# Patient Record
Sex: Male | Born: 1950
Health system: Southern US, Community
[De-identification: ages and names within clinical notes are randomized; demographics above are authoritative.]

## PROBLEM LIST (undated history)

## (undated) DIAGNOSIS — S83249A Other tear of medial meniscus, current injury, unspecified knee, initial encounter: Secondary | ICD-10-CM

## (undated) DIAGNOSIS — E785 Hyperlipidemia, unspecified: Secondary | ICD-10-CM

## (undated) DIAGNOSIS — I2510 Atherosclerotic heart disease of native coronary artery without angina pectoris: Secondary | ICD-10-CM

## (undated) DIAGNOSIS — F17200 Nicotine dependence, unspecified, uncomplicated: Secondary | ICD-10-CM

## (undated) DIAGNOSIS — I10 Essential (primary) hypertension: Secondary | ICD-10-CM

## (undated) DIAGNOSIS — I714 Abdominal aortic aneurysm, without rupture, unspecified: Secondary | ICD-10-CM

## (undated) DIAGNOSIS — C4490 Unspecified malignant neoplasm of skin, unspecified: Secondary | ICD-10-CM

## (undated) DIAGNOSIS — F329 Major depressive disorder, single episode, unspecified: Secondary | ICD-10-CM

## (undated) DIAGNOSIS — M1990 Unspecified osteoarthritis, unspecified site: Secondary | ICD-10-CM

## (undated) DIAGNOSIS — I219 Acute myocardial infarction, unspecified: Secondary | ICD-10-CM

## (undated) DIAGNOSIS — F32A Depression, unspecified: Secondary | ICD-10-CM

## (undated) DIAGNOSIS — G4730 Sleep apnea, unspecified: Secondary | ICD-10-CM

## (undated) HISTORY — DX: Essential (primary) hypertension: I10

## (undated) HISTORY — DX: Hyperlipidemia, unspecified: E78.5

## (undated) HISTORY — DX: Atherosclerotic heart disease of native coronary artery without angina pectoris: I25.10

## (undated) HISTORY — DX: Unspecified malignant neoplasm of skin, unspecified: C44.90

---
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
## 1986-12-19 HISTORY — PX: CHOLECYSTECTOMY: SHX55

## 1986-12-19 HISTORY — PX: APPENDECTOMY: SHX54

## 2008-12-19 HISTORY — PX: CERVICAL FUSION: SHX112

## 2015-06-01 HISTORY — PX: CARDIAC CATHETERIZATION: SHX172

## 2015-07-06 ENCOUNTER — Telehealth: Payer: Self-pay | Admitting: Internal Medicine

## 2015-07-06 ENCOUNTER — Encounter: Payer: Self-pay | Admitting: Internal Medicine

## 2015-07-07 NOTE — Telephone Encounter (Signed)
Close encounter 

## 2015-08-17 DIAGNOSIS — B029 Zoster without complications: Secondary | ICD-10-CM | POA: Diagnosis not present

## 2015-08-18 ENCOUNTER — Ambulatory Visit: Payer: Self-pay | Admitting: Internal Medicine

## 2015-09-08 ENCOUNTER — Encounter: Payer: Self-pay | Admitting: Internal Medicine

## 2015-09-08 ENCOUNTER — Ambulatory Visit (INDEPENDENT_AMBULATORY_CARE_PROVIDER_SITE_OTHER): Payer: Medicare Other | Admitting: Internal Medicine

## 2015-09-08 VITALS — BP 142/80 | HR 66 | Ht 75.0 in | Wt 256.0 lb

## 2015-09-08 DIAGNOSIS — Z79899 Other long term (current) drug therapy: Secondary | ICD-10-CM

## 2015-09-08 DIAGNOSIS — Z720 Tobacco use: Secondary | ICD-10-CM | POA: Insufficient documentation

## 2015-09-08 DIAGNOSIS — F17200 Nicotine dependence, unspecified, uncomplicated: Secondary | ICD-10-CM

## 2015-09-08 DIAGNOSIS — N529 Male erectile dysfunction, unspecified: Secondary | ICD-10-CM | POA: Insufficient documentation

## 2015-09-08 DIAGNOSIS — R0989 Other specified symptoms and signs involving the circulatory and respiratory systems: Secondary | ICD-10-CM | POA: Diagnosis not present

## 2015-09-08 DIAGNOSIS — R42 Dizziness and giddiness: Secondary | ICD-10-CM | POA: Insufficient documentation

## 2015-09-08 DIAGNOSIS — I10 Essential (primary) hypertension: Secondary | ICD-10-CM

## 2015-09-08 DIAGNOSIS — I2510 Atherosclerotic heart disease of native coronary artery without angina pectoris: Secondary | ICD-10-CM

## 2015-09-08 DIAGNOSIS — Z955 Presence of coronary angioplasty implant and graft: Secondary | ICD-10-CM | POA: Insufficient documentation

## 2015-09-08 DIAGNOSIS — E785 Hyperlipidemia, unspecified: Secondary | ICD-10-CM | POA: Diagnosis not present

## 2015-09-08 DIAGNOSIS — N528 Other male erectile dysfunction: Secondary | ICD-10-CM

## 2015-09-08 MED ORDER — METOPROLOL TARTRATE 25 MG PO TABS: 12.5000 mg | ORAL_TABLET | Freq: Two times a day (BID) | ORAL | Status: DC

## 2015-09-08 MED ORDER — LISINOPRIL 5 MG PO TABS: 5.0000 mg | ORAL_TABLET | Freq: Every day | ORAL | Status: DC

## 2015-09-08 NOTE — Progress Notes (Signed)
OFFICE NOTE  Chief Complaint:  Establish cardiologist  Primary Care Physician: Irven Shelling, MD  HPI:  Nathan Jones is a pleasant 64 year old male who recently had an acute MI in June 2016. This was in the [place] area. He tells me that the day of the MI he was at home and started having chest tightness and radiating pain to his left arm. He became acutely diaphoretic and ultimately called EMS. He was taken the hospital but that the time he got there his symptoms had resolved. Initial troponins were negative however he was monitored overnight and troponin then became positive. He underwent cardiac catheterization the next day and was found to have a severe proximal LAD lesion. He subsequently had a science 3.25 mm x 15 mm stent placed. 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. He reported a marked improvement in his energy level and symptoms after the stent placement however recently his wife noted that his energy levels have been declined somewhat. In review of his current medications he is taking Effient 10 mg daily but not taking any aspirin. He says that the cardiologist told him not to take aspirin, which is very hard to believe. He also is on lisinopril and metoprolol, 2 medicines which she said he was told that he would not need to take forever. Finally he is on Zocor which she says is helped his cholesterol, but he's been intolerant to Crestor and Lipitor in the past. He also describes some dizziness which he takes meclizine occasionally for.  PMHx:  Past Medical History  Diagnosis Date  . Hypertension   . CAD (coronary artery disease)   . Hyperlipidemia   . Skin cancer     Past Surgical History  Procedure Laterality Date  . Cardiac catheterization  06/01/2015    Xience DES to LAD (3.42mmx15mm) - [facility]  . Cholecystectomy  1988  . Appendectomy   1988  . Cervical fusion  2010    FAMHx:  Family History  Problem Relation Age of Onset  . Heart disease Father     pacemaker  . Heart disease Maternal Grandfather   . Stroke Maternal Grandmother     SOCHx:   reports that he has been smoking Cigarettes.  He has a 45 pack-year smoking history. He has never used smokeless tobacco. He reports that he drinks alcohol. He reports that he does not use illicit drugs.  ALLERGIES:  No Known Allergies  ROS: A comprehensive review of systems was negative except for: Neurological: positive for dizziness  HOME MEDS: Current Outpatient Prescriptions  Medication Sig Dispense Refill  . acetaminophen (TYLENOL) 650 MG CR tablet Take 650 mg by mouth as needed for pain.    Marland Kitchen aspirin EC 81 MG tablet Take 81 mg by mouth daily.    . Avanafil (STENDRA PO) Take by mouth as needed.    Marland Kitchen EFFIENT 10 MG TABS tablet Take 1 tablet by mouth daily.    Marland Kitchen lisinopril (PRINIVIL,ZESTRIL) 5 MG tablet Take 1 tablet (5 mg total) by mouth daily. 90 tablet 3  . meclizine (ANTIVERT) 25 MG tablet Take 1 tablet by mouth 2 (two) times daily.    . metoprolol tartrate (LOPRESSOR) 25 MG tablet Take 0.5 tablets (12.5 mg total) by mouth 2 (two) times daily. 90 tablet 3  . simvastatin (ZOCOR) 20 MG tablet Take 1 tablet by  mouth daily.     No current facility-administered medications for this visit.    LABS/IMAGING: No results found for this or any previous visit (from the past 48 hour(s)). No results found.  WEIGHTS: Wt Readings from Last 3 Encounters:  09/08/15 256 lb (116.121 kg)    VITALS: BP 142/80 mmHg  Pulse 66  Ht 6\' 3"  (1.905 m)  Wt 256 lb (116.121 kg)  BMI 32.00 kg/m2  EXAM: General appearance: alert and no distress Neck: no carotid bruit, no JVD and thyroid not enlarged, symmetric, no tenderness/mass/nodules Lungs: clear to auscultation bilaterally Heart: regular rate and rhythm, S1, S2 normal, no murmur, click, rub or gallop Abdomen: soft, non-tender;  bowel sounds normal; no masses,  no organomegaly Extremities: extremities normal, atraumatic, no cyanosis or edema Pulses: 2+ and symmetric Skin: Skin color, texture, turgor normal. No rashes or lesions Neurologic: Grossly normal Psych: Pleasant  EKG: Normal sinus rhythm at 66, left anterior fascicular block  ASSESSMENT: 1. Coronary artery disease status post science DES (3.25 mm 15 mm) to the proximal LAD 2. Dyslipidemia 3. Hypertension 4. Dizziness 5. Tobacco abuse  PLAN: 1.   Mr. Zielke generally feels well but has had some mild fatigue after stent placement to the LAD. He reports compliance with medications although he was told he did not need to be on aspirin. I reiterated the significant importance of dual antiplatelets therapy after an acute MI, especially with a drug-eluting stent. He will need to get 81 mg aspirin and take it in addition to Effient for at least 1 year uninterrupted. He will need a repeat check of his cholesterol and I'll order that along with metabolic profile. Blood pressure appears to be well-controlled. On exam today there was an abdominal bruit, therefore I'll order an ultrasound as he has a smoking history. We talked about smoking cessation today, but he said it's very difficult as is under stress, was moving and the fact that his wife smokes as well. We will request complete records from the hospital in [place].  Plan to see him back in 6-8 weeks to discuss the findings of his blood work and ultrasound.  Pixie Casino, MD, [facility] Attending Cardiologist [place] C Hilty 09/08/2015, 5:34 PM

## 2015-09-08 NOTE — Patient Instructions (Signed)
Medication Instructions:   START aspirin 81mg  once daily  Labwork:  FASTING lab work in 1-2 weeks - to check cholesterol & metabolic panel  Testing/Procedures:  Your physician has requested that you have an abdominal aorta duplex. During this test, an ultrasound is used to evaluate the aorta. Allow 30 minutes for this exam. Do not eat after midnight the day before and avoid carbonated beverages  Follow-Up:  6-8 week visit with Dr. Debara Pickett  Any Other Special Instructions Will Be Listed Below (If Applicable).

## 2015-09-09 ENCOUNTER — Encounter: Payer: Self-pay | Admitting: *Deleted

## 2015-09-15 ENCOUNTER — Ambulatory Visit (HOSPITAL_COMMUNITY)
Admission: RE | Admit: 2015-09-15 | Discharge: 2015-09-15 | Disposition: A | Discharge status: Home / Self Care | Payer: Medicare Other | Source: Ambulatory Visit | Attending: Internal Medicine | Admitting: Internal Medicine

## 2015-09-15 DIAGNOSIS — F17200 Nicotine dependence, unspecified, uncomplicated: Secondary | ICD-10-CM | POA: Insufficient documentation

## 2015-09-15 DIAGNOSIS — R0989 Other specified symptoms and signs involving the circulatory and respiratory systems: Secondary | ICD-10-CM | POA: Diagnosis not present

## 2015-09-15 DIAGNOSIS — Z720 Tobacco use: Secondary | ICD-10-CM | POA: Diagnosis not present

## 2015-09-15 DIAGNOSIS — I10 Essential (primary) hypertension: Secondary | ICD-10-CM | POA: Insufficient documentation

## 2015-09-15 DIAGNOSIS — I771 Stricture of artery: Secondary | ICD-10-CM | POA: Diagnosis not present

## 2015-09-15 DIAGNOSIS — E785 Hyperlipidemia, unspecified: Secondary | ICD-10-CM | POA: Insufficient documentation

## 2015-09-16 ENCOUNTER — Other Ambulatory Visit: Payer: Self-pay | Admitting: *Deleted

## 2015-09-16 DIAGNOSIS — I714 Abdominal aortic aneurysm, without rupture, unspecified: Secondary | ICD-10-CM

## 2015-09-16 DIAGNOSIS — E785 Hyperlipidemia, unspecified: Secondary | ICD-10-CM | POA: Diagnosis not present

## 2015-09-17 DIAGNOSIS — R42 Dizziness and giddiness: Secondary | ICD-10-CM | POA: Diagnosis not present

## 2015-09-20 LAB — CARDIO IQ(R) ADVANCED LIPID PANEL
Apolipoprotein B: 95 mg/dL (ref 52–109)
CHOLESTEROL/HDL RATIO (CARDIO IQ ADV LIPID PANEL): 5.2 calc — AB (ref <=5.0)
Cholesterol, Total: 156 mg/dL (ref 125–200)
HDL Cholesterol: 30 mg/dL — ABNORMAL LOW (ref >=40)
LDL LARGE: 5732 nmol/L (ref 4334–10815)
LDL Medium: 301 nmol/L (ref 167–465)
LDL Particle Number: 1514 nmol/L (ref 1016–2185)
LDL Peak Size: 210.9 Angstrom — ABNORMAL LOW (ref >=218.2)
LDL SMALL: 379 nmol/L (ref 123–441)
LDL, Calculated: 93 mg/dL
LIPOPROTEIN (A) (CARDIO IQ ADV LIPID PANEL): 235 nmol/L — AB (ref <75)
Non-HDL Cholesterol: 126 mg/dL
TRIGLYCERIDES (CARDIO IQ ADV LIPID PANEL): 164 mg/dL — AB

## 2015-09-21 ENCOUNTER — Other Ambulatory Visit: Payer: Self-pay | Admitting: *Deleted

## 2015-09-21 ENCOUNTER — Other Ambulatory Visit: Payer: Self-pay | Admitting: Otolaryngology

## 2015-09-21 DIAGNOSIS — R42 Dizziness and giddiness: Secondary | ICD-10-CM

## 2015-09-21 MED ORDER — ATORVASTATIN CALCIUM 80 MG PO TABS: 80.0000 mg | ORAL_TABLET | Freq: Every day | ORAL | Status: DC

## 2015-09-22 DIAGNOSIS — E785 Hyperlipidemia, unspecified: Secondary | ICD-10-CM | POA: Diagnosis not present

## 2015-09-22 DIAGNOSIS — I10 Essential (primary) hypertension: Secondary | ICD-10-CM | POA: Diagnosis not present

## 2015-09-22 DIAGNOSIS — I2510 Atherosclerotic heart disease of native coronary artery without angina pectoris: Secondary | ICD-10-CM | POA: Diagnosis not present

## 2015-09-22 DIAGNOSIS — C4490 Unspecified malignant neoplasm of skin, unspecified: Secondary | ICD-10-CM | POA: Diagnosis not present

## 2015-09-22 DIAGNOSIS — Z23 Encounter for immunization: Secondary | ICD-10-CM | POA: Diagnosis not present

## 2015-09-22 DIAGNOSIS — B029 Zoster without complications: Secondary | ICD-10-CM | POA: Diagnosis not present

## 2015-09-28 DIAGNOSIS — D485 Neoplasm of uncertain behavior of skin: Secondary | ICD-10-CM | POA: Diagnosis not present

## 2015-09-28 DIAGNOSIS — C44629 Squamous cell carcinoma of skin of left upper limb, including shoulder: Secondary | ICD-10-CM | POA: Diagnosis not present

## 2015-09-28 DIAGNOSIS — L821 Other seborrheic keratosis: Secondary | ICD-10-CM | POA: Diagnosis not present

## 2015-09-28 DIAGNOSIS — L570 Actinic keratosis: Secondary | ICD-10-CM | POA: Diagnosis not present

## 2015-09-28 DIAGNOSIS — L237 Allergic contact dermatitis due to plants, except food: Secondary | ICD-10-CM | POA: Diagnosis not present

## 2015-09-28 DIAGNOSIS — Z85828 Personal history of other malignant neoplasm of skin: Secondary | ICD-10-CM | POA: Diagnosis not present

## 2015-10-01 ENCOUNTER — Encounter: Payer: Self-pay | Admitting: Internal Medicine

## 2015-10-05 ENCOUNTER — Ambulatory Visit
Admission: RE | Admit: 2015-10-05 | Discharge: 2015-10-05 | Disposition: A | Discharge status: Home / Self Care | Payer: Medicare Other | Source: Ambulatory Visit | Attending: Otolaryngology | Admitting: Otolaryngology

## 2015-10-05 DIAGNOSIS — R42 Dizziness and giddiness: Secondary | ICD-10-CM

## 2015-10-05 MED ORDER — GADOBENATE DIMEGLUMINE 529 MG/ML IV SOLN
20.0000 mL | Freq: Once | INTRAVENOUS | Status: AC | PRN
  Administered 2015-10-05: 20 mL via INTRAVENOUS

## 2015-10-11 ENCOUNTER — Encounter: Payer: Self-pay | Admitting: Internal Medicine

## 2015-10-12 MED ORDER — PRAVASTATIN SODIUM 40 MG PO TABS: 40.0000 mg | ORAL_TABLET | Freq: Every evening | ORAL | Status: DC

## 2015-10-13 DIAGNOSIS — M545 Low back pain: Secondary | ICD-10-CM | POA: Diagnosis not present

## 2015-10-21 ENCOUNTER — Encounter: Payer: Self-pay | Admitting: Internal Medicine

## 2015-10-27 DIAGNOSIS — C44629 Squamous cell carcinoma of skin of left upper limb, including shoulder: Secondary | ICD-10-CM | POA: Diagnosis not present

## 2015-11-11 ENCOUNTER — Encounter: Payer: Self-pay | Admitting: Internal Medicine

## 2015-11-11 ENCOUNTER — Ambulatory Visit (INDEPENDENT_AMBULATORY_CARE_PROVIDER_SITE_OTHER): Payer: Medicare Other | Admitting: Internal Medicine

## 2015-11-11 VITALS — BP 124/68 | HR 76 | Ht 75.0 in | Wt 262.0 lb

## 2015-11-11 DIAGNOSIS — I2510 Atherosclerotic heart disease of native coronary artery without angina pectoris: Secondary | ICD-10-CM | POA: Diagnosis not present

## 2015-11-11 DIAGNOSIS — I7409 Other arterial embolism and thrombosis of abdominal aorta: Secondary | ICD-10-CM | POA: Diagnosis not present

## 2015-11-11 DIAGNOSIS — I714 Abdominal aortic aneurysm, without rupture, unspecified: Secondary | ICD-10-CM

## 2015-11-11 DIAGNOSIS — Z955 Presence of coronary angioplasty implant and graft: Secondary | ICD-10-CM | POA: Diagnosis not present

## 2015-11-11 DIAGNOSIS — I10 Essential (primary) hypertension: Secondary | ICD-10-CM | POA: Diagnosis not present

## 2015-11-11 NOTE — Progress Notes (Signed)
OFFICE NOTE  Chief Complaint:  Follow-up abdominal ultrasound  Primary Care Physician: Irven Shelling, MD  HPI:  Nathan Jones is a pleasant 64 year old male who recently had an acute MI in June 2016. This was in the [place] area. He tells me that the day of the MI he was at home and started having chest tightness and radiating pain to his left arm. He became acutely diaphoretic and ultimately called EMS. He was taken the hospital but that the time he got there his symptoms had resolved. Initial troponins were negative however he was monitored overnight and troponin then became positive. He underwent cardiac catheterization the next day and was found to have a severe proximal LAD lesion. He subsequently had a science 3.25 mm x 15 mm stent placed. 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. He reported a marked improvement in his energy level and symptoms after the stent placement however recently his wife noted that his energy levels have been declined somewhat. In review of his current medications he is taking Effient 10 mg daily but not taking any aspirin. He says that the cardiologist told him not to take aspirin, which is very hard to believe. He also is on lisinopril and metoprolol, 2 medicines which she said he was told that he would not need to take forever. Finally he is on Zocor which she says is helped his cholesterol, but he's been intolerant to Crestor and Lipitor in the past. He also describes some dizziness which he takes meclizine occasionally for.  Nathan Jones returns today for follow-up. He underwent abdominal ultrasound which demonstrated a 3 x 3.16.8 cm fusiform abdominal aortic aneurysm for the mid to distal aorta. There was also bilateral aortoiliac disease which is mild to moderate. I reiterated the fact that continued tobacco abuse is contributing to this. And also it  is important for him to have good cholesterol control. He tells me that this past weekend he stopped taking pravastatin because he is having problems with his stomach. He developed some constipation and bloating. He checked with the side effects of that medicine and it was listed under the 1% side effects. He says he stopped the pravastatin and thought that he felt a little bit better, but it also been doing a bowel regimen including stool softener, MiraLAX, simethicone and other over-the-counter medications.  PMHx:  Past Medical History  Diagnosis Date  . Hypertension   . CAD (coronary artery disease)   . Hyperlipidemia   . Skin cancer     Past Surgical History  Procedure Laterality Date  . Cardiac catheterization  06/01/2015    Xience DES to LAD (3.24mmx15mm) - [facility]  . Cholecystectomy  1988  . Appendectomy  1988  . Cervical fusion  2010    FAMHx:  Family History  Problem Relation Age of Onset  . Heart disease Father     pacemaker  . Heart disease Maternal Grandfather   . Stroke Maternal Grandmother     SOCHx:   reports that he has been smoking Cigarettes.  He has a 45 pack-year smoking history. He has never used smokeless tobacco. He reports that he drinks alcohol. He reports that he does not use illicit drugs.  ALLERGIES:  Allergies  Allergen Reactions  . Pravastatin Other (See Comments)    Constipation, gas, stomach pains    ROS: A  comprehensive review of systems was negative.  HOME MEDS: Current Outpatient Prescriptions  Medication Sig Dispense Refill  . acetaminophen (TYLENOL) 650 MG CR tablet Take 650 mg by mouth as needed for pain.    Marland Kitchen aspirin EC 81 MG tablet Take 81 mg by mouth daily.    . Avanafil (STENDRA PO) Take by mouth as needed.    Marland Kitchen EFFIENT 10 MG TABS tablet Take 1 tablet by mouth daily.    Marland Kitchen lisinopril (PRINIVIL,ZESTRIL) 5 MG tablet Take 1 tablet (5 mg total) by mouth daily. 90 tablet 3  . meclizine (ANTIVERT) 25 MG tablet Take 1  tablet by mouth 2 (two) times daily.    . metoprolol tartrate (LOPRESSOR) 25 MG tablet Take 0.5 tablets (12.5 mg total) by mouth 2 (two) times daily. 90 tablet 3  . pravastatin (PRAVACHOL) 40 MG tablet Take 1 tablet (40 mg total) by mouth every evening. 30 tablet 5   No current facility-administered medications for this visit.    LABS/IMAGING: No results found for this or any previous visit (from the past 48 hour(s)). No results found.  WEIGHTS: Wt Readings from Last 3 Encounters:  11/11/15 262 lb (118.842 kg)  09/08/15 256 lb (116.121 kg)    VITALS: BP 124/68 mmHg  Pulse 76  Ht 6\' 3"  (1.905 m)  Wt 262 lb (118.842 kg)  BMI 32.75 kg/m2  EXAM: Deferred  EKG: Deferred  ASSESSMENT: 1. Coronary artery disease status post Xience DES (3.25 mm 15 mm) to the proximal LAD, mild RCA and LCx disease, EF 55-60% 2. Dyslipidemia 3. Hypertension 4. Dizziness 5. Tobacco abuse 6. AAA 7. Bilateral aortoiliac disease  PLAN: 1.   Nathan Jones is doing fairly well. He had an episode with abdominal bloating which she attributed possibly to Pravachol. He stopped the medicine I asked him to stay off of it for couple of weeks. If his symptoms resolve he should reconsider starting it and contact me if the symptoms come back again. His abdominal and aneurysm screening was positive for abdominal aortic aneurysm. Although a small it will need to be followed by ultrasound again in one year. He also has bilateral mild to moderate aortoiliac disease. He denies any claudication or symptoms such as Leriche syndrome. I underscored the importance of continued dual antiplatelet therapy for at least one year uninterrupted. In addition, I've asked him to reach out to me if he's interested in smoking cessation. Plan to see him back in 6 months. We'll repeat his abdominal ultrasound in one year.  Pixie Casino, MD, [facility] Attending Cardiologist [place] C Vergie Zahm 11/11/2015, 9:39 AM

## 2015-11-11 NOTE — Patient Instructions (Signed)
Your physician wants you to follow-up in: 6 months with Dr. Hilty. You will receive a reminder letter in the mail two months in advance. If you don't receive a letter, please call our office to schedule the follow-up appointment.    

## 2015-11-19 DIAGNOSIS — B009 Herpesviral infection, unspecified: Secondary | ICD-10-CM | POA: Diagnosis not present

## 2016-01-12 ENCOUNTER — Encounter: Payer: Self-pay | Admitting: Internal Medicine

## 2016-01-28 DIAGNOSIS — G4733 Obstructive sleep apnea (adult) (pediatric): Secondary | ICD-10-CM | POA: Diagnosis not present

## 2016-01-28 DIAGNOSIS — E669 Obesity, unspecified: Secondary | ICD-10-CM | POA: Diagnosis not present

## 2016-01-28 DIAGNOSIS — I739 Peripheral vascular disease, unspecified: Secondary | ICD-10-CM | POA: Diagnosis not present

## 2016-01-28 DIAGNOSIS — I714 Abdominal aortic aneurysm, without rupture: Secondary | ICD-10-CM | POA: Diagnosis not present

## 2016-01-28 DIAGNOSIS — I2510 Atherosclerotic heart disease of native coronary artery without angina pectoris: Secondary | ICD-10-CM | POA: Diagnosis not present

## 2016-01-28 DIAGNOSIS — I252 Old myocardial infarction: Secondary | ICD-10-CM | POA: Diagnosis not present

## 2016-01-28 DIAGNOSIS — E7800 Pure hypercholesterolemia, unspecified: Secondary | ICD-10-CM | POA: Diagnosis not present

## 2016-01-28 DIAGNOSIS — Z6833 Body mass index (BMI) 33.0-33.9, adult: Secondary | ICD-10-CM | POA: Diagnosis not present

## 2016-01-28 DIAGNOSIS — F17200 Nicotine dependence, unspecified, uncomplicated: Secondary | ICD-10-CM | POA: Diagnosis not present

## 2016-03-07 ENCOUNTER — Telehealth: Payer: Self-pay | Admitting: Internal Medicine

## 2016-03-07 ENCOUNTER — Encounter: Payer: Self-pay | Admitting: Internal Medicine

## 2016-03-07 NOTE — Telephone Encounter (Signed)
I replied to him in [place].  DR.H

## 2016-03-07 NOTE — Telephone Encounter (Signed)
New message      Calling to see if the nurse has seen his mychart message.  Please read it and call pt

## 2016-03-07 NOTE — Telephone Encounter (Signed)
Encounter Messages  Expand AllCollapse All     Non-Urgent Medical Question     From   [facility]    To   Pixie Casino, MD    Sent   03/07/2016 6:27 AM       Starting about two weeks ago, I have been experiencing moderate pain down the back pf my right leg. Started working out in a [employer], so thought it might be related, but I have changed routine several times (frequency/weigh/reps/etc) - with no noticeable change in issues.   Pain is tolerable, most pronounced in morning - take two Tylenol tablets (500 mg each) in morning - pain only moderates, but is still noticeable.   Also, beginning to have bruising issues (usually on my arms and hands) that I cannot associated with injuries. I am fairly active in the yard (grass cutting, raking, etc).   Not sure if any of this is related to current meds, but wanted to bring to your attention. I am most concerned about the pain radiating down my right leg.   Thanks, Rhona Raider with patient - pain in leg is not relieved with rest Patient is having to take tylenol QAM and sometimes QHS  Informed him that I will make MD aware of his message/concerns.

## 2016-03-10 DIAGNOSIS — M5431 Sciatica, right side: Secondary | ICD-10-CM | POA: Diagnosis not present

## 2016-03-28 DIAGNOSIS — D0421 Carcinoma in situ of skin of right ear and external auricular canal: Secondary | ICD-10-CM | POA: Diagnosis not present

## 2016-03-28 DIAGNOSIS — D0462 Carcinoma in situ of skin of left upper limb, including shoulder: Secondary | ICD-10-CM | POA: Diagnosis not present

## 2016-03-28 DIAGNOSIS — D485 Neoplasm of uncertain behavior of skin: Secondary | ICD-10-CM | POA: Diagnosis not present

## 2016-03-28 DIAGNOSIS — L84 Corns and callosities: Secondary | ICD-10-CM | POA: Diagnosis not present

## 2016-03-28 DIAGNOSIS — L570 Actinic keratosis: Secondary | ICD-10-CM | POA: Diagnosis not present

## 2016-04-12 ENCOUNTER — Other Ambulatory Visit: Payer: Self-pay | Admitting: Internal Medicine

## 2016-04-12 DIAGNOSIS — M5431 Sciatica, right side: Secondary | ICD-10-CM

## 2016-04-19 ENCOUNTER — Ambulatory Visit
Admission: RE | Admit: 2016-04-19 | Discharge: 2016-04-19 | Disposition: A | Discharge status: Home / Self Care | Payer: Medicare Other | Source: Ambulatory Visit | Attending: Internal Medicine | Admitting: Internal Medicine

## 2016-04-19 DIAGNOSIS — M5431 Sciatica, right side: Secondary | ICD-10-CM

## 2016-04-19 DIAGNOSIS — M4806 Spinal stenosis, lumbar region: Secondary | ICD-10-CM | POA: Diagnosis not present

## 2016-04-23 ENCOUNTER — Other Ambulatory Visit: Payer: Self-pay | Admitting: Internal Medicine

## 2016-04-25 NOTE — Telephone Encounter (Signed)
Rx request sent to pharmacy.  

## 2016-05-10 DIAGNOSIS — Z6833 Body mass index (BMI) 33.0-33.9, adult: Secondary | ICD-10-CM | POA: Diagnosis not present

## 2016-05-10 DIAGNOSIS — M5126 Other intervertebral disc displacement, lumbar region: Secondary | ICD-10-CM | POA: Diagnosis not present

## 2016-05-19 ENCOUNTER — Ambulatory Visit (INDEPENDENT_AMBULATORY_CARE_PROVIDER_SITE_OTHER): Payer: Medicare Other | Admitting: Internal Medicine

## 2016-05-19 ENCOUNTER — Encounter: Payer: Self-pay | Admitting: Internal Medicine

## 2016-05-19 VITALS — BP 118/72 | HR 66 | Ht 75.0 in | Wt 265.0 lb

## 2016-05-19 DIAGNOSIS — Z720 Tobacco use: Secondary | ICD-10-CM | POA: Diagnosis not present

## 2016-05-19 DIAGNOSIS — E785 Hyperlipidemia, unspecified: Secondary | ICD-10-CM

## 2016-05-19 DIAGNOSIS — I7409 Other arterial embolism and thrombosis of abdominal aorta: Secondary | ICD-10-CM

## 2016-05-19 DIAGNOSIS — I2510 Atherosclerotic heart disease of native coronary artery without angina pectoris: Secondary | ICD-10-CM | POA: Insufficient documentation

## 2016-05-19 DIAGNOSIS — I714 Abdominal aortic aneurysm, without rupture, unspecified: Secondary | ICD-10-CM

## 2016-05-19 DIAGNOSIS — F17200 Nicotine dependence, unspecified, uncomplicated: Secondary | ICD-10-CM | POA: Insufficient documentation

## 2016-05-19 DIAGNOSIS — I10 Essential (primary) hypertension: Secondary | ICD-10-CM

## 2016-05-19 LAB — LIPID PANEL
CHOLESTEROL: 150 mg/dL (ref 125–200)
HDL: 31 mg/dL — ABNORMAL LOW (ref >=40)
LDL Cholesterol: 90 mg/dL (ref <130)
TRIGLYCERIDES: 147 mg/dL (ref <150)
Total CHOL/HDL Ratio: 4.8 Ratio (ref <=5.0)
VLDL: 29 mg/dL (ref <30)

## 2016-05-19 NOTE — Progress Notes (Signed)
OFFICE NOTE  Chief Complaint:  Routine follow-up  Primary Care Physician: Irven Shelling, MD  HPI:  Nathan Jones is a pleasant 65 year old male who recently had an acute MI in June 2016. This was in the [place] area. He tells me that the day of the MI he was at home and started having chest tightness and radiating pain to his left arm. He became acutely diaphoretic and ultimately called EMS. He was taken the hospital but that the time he got there his symptoms had resolved. Initial troponins were negative however he was monitored overnight and troponin then became positive. He underwent cardiac catheterization the next day and was found to have a severe proximal LAD lesion. He subsequently had a science 3.25 mm x 15 mm stent placed. 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. He reported a marked improvement in his energy level and symptoms after the stent placement however recently his wife noted that his energy levels have been declined somewhat. In review of his current medications he is taking Effient 10 mg daily but not taking any aspirin. He says that the cardiologist told him not to take aspirin, which is very hard to believe. He also is on lisinopril and metoprolol, 2 medicines which she said he was told that he would not need to take forever. Finally he is on Zocor which she says is helped his cholesterol, but he's been intolerant to Crestor and Lipitor in the past. He also describes some dizziness which he takes meclizine occasionally for.  Nathan Jones returns today for follow-up. He underwent abdominal ultrasound which demonstrated a 3 x 3.16.8 cm fusiform abdominal aortic aneurysm for the mid to distal aorta. There was also bilateral aortoiliac disease which is mild to moderate. I reiterated the fact that continued tobacco abuse is contributing to this. And also it is important  for him to have good cholesterol control. He tells me that this past weekend he stopped taking pravastatin because he is having problems with his stomach. He developed some constipation and bloating. He checked with the side effects of that medicine and it was listed under the 1% side effects. He says he stopped the pravastatin and thought that he felt a little bit better, but it also been doing a bowel regimen including stool softener, MiraLAX, simethicone and other over-the-counter medications.  05/19/2016  Nathan Jones returns today for follow-up. He is doing very well. He denies any chest pain or worsening shortness of breath. We switched him to atorvastatin but he was intolerant of this. Ultimately was placed on pravastatin 40 mg which she seems to be tolerating. It is been one year since his drug-eluting stent and he can likely come off of Effient which she is interested in. As mentioned before he does have a small abdominal aortic aneurysm which will need reassessment this fall. Unfortunately continues to smoke but has tried to cut back.  PMHx:  Past Medical History  Diagnosis Date  . Hypertension   . CAD (coronary artery disease)   . Hyperlipidemia   . Skin cancer     Past Surgical History  Procedure Laterality Date  . Cardiac catheterization  06/01/2015    Xience DES to LAD (3.55mmx15mm) - [facility]  . Cholecystectomy  1988  . Appendectomy  1988  . Cervical fusion  2010    FAMHx:  Family History  Problem Relation Age of Onset  . Heart disease Father     pacemaker  . Heart disease Maternal Grandfather   . Stroke Maternal Grandmother     SOCHx:   reports that he has been smoking Cigarettes.  He has a 45 pack-year smoking history. He has never used smokeless tobacco. He reports that he drinks alcohol. He reports that he does not use illicit drugs.  ALLERGIES:  Allergies  Allergen Reactions  . Atorvastatin     Gas, stomach pains    ROS: Pertinent items noted  in HPI and remainder of comprehensive ROS otherwise negative.  HOME MEDS: Current Outpatient Prescriptions  Medication Sig Dispense Refill  . acetaminophen (TYLENOL) 650 MG CR tablet Take 650 mg by mouth as needed for pain.    Marland Kitchen aspirin EC 81 MG tablet Take 81 mg by mouth daily.    . Avanafil (STENDRA PO) Take by mouth daily as needed.    Marland Kitchen lisinopril (PRINIVIL,ZESTRIL) 5 MG tablet Take 1 tablet (5 mg total) by mouth daily. 90 tablet 3  . metoprolol tartrate (LOPRESSOR) 25 MG tablet Take 0.5 tablets (12.5 mg total) by mouth 2 (two) times daily. 90 tablet 3  . pravastatin (PRAVACHOL) 40 MG tablet TAKE ONE TABLET (40 MG) BY MOUTH EVERY EVENING. 30 tablet 4  . ValACYclovir HCl (VALTREX PO) Take 1,000 mg by mouth as needed.     No current facility-administered medications for this visit.    LABS/IMAGING: No results found for this or any previous visit (from the past 48 hour(s)). No results found.  WEIGHTS: Wt Readings from Last 3 Encounters:  05/19/16 265 lb (120.203 kg)  11/11/15 262 lb (118.842 kg)  09/08/15 256 lb (116.121 kg)    VITALS: BP 118/72 mmHg  Pulse 66  Ht 6\' 3"  (1.905 m)  Wt 265 lb (120.203 kg)  BMI 33.12 kg/m2  EXAM: General appearance: alert and no distress Lungs: clear to auscultation bilaterally Heart: regular rate and rhythm, S1, S2 normal, no murmur, click, rub or gallop Extremities: extremities normal, atraumatic, no cyanosis or edema Skin: some ecchymosis Neurologic: Grossly normal  EKG: NSR at 66  ASSESSMENT: 1. Coronary artery disease status post Xience DES (3.25 mm 15 mm) to the proximal LAD, mild RCA and LCx disease, EF 55-60% (05/2015) 2. Dyslipidemia 3. Hypertension 4. Dizziness 5. Tobacco abuse 6. AAA 7. Bilateral aortoiliac disease  PLAN: 1.   Nathan Jones continues to do well after his stent 1 year ago. At this point he can discontinue Effient and he will remain on low-dose aspirin. He will need a follow-up ultrasound of his AAA which  will be done in September of this year. He is tolerating pravastatin and we'll need to recheck his lipid profile to see if adjustments need to be made. Unfortunately he was intolerant of atorvastatin. He is continue to work on tobacco cessation however he says it's quite difficult as his wife and daughter all smoke as well. Plan to see him back in 6 months.  Pixie Casino, MD, [facility] Attending Cardiologist [facility]  Pixie Casino 05/19/2016, 9:18 AM

## 2016-05-19 NOTE — Patient Instructions (Addendum)
Your physician has recommended you make the following change in your medication: STOP effient  Your physician recommends that you return for lab work FASTING  Your physician wants you to follow-up in: 6 months with Dr. Debara Pickett. You will receive a reminder letter in the mail two months in advance. If you don't receive a letter, please call our office to schedule the follow-up appointment.

## 2016-05-20 ENCOUNTER — Encounter: Payer: Self-pay | Admitting: Internal Medicine

## 2016-05-26 ENCOUNTER — Telehealth: Payer: Self-pay | Admitting: Internal Medicine

## 2016-05-26 NOTE — Telephone Encounter (Signed)
All questions addressed regarding pt's e-mail inquiry. Advised OK to proceed w routine dental work since he has been off Effient x1 week. Pt voiced thanks.

## 2016-05-26 NOTE — Telephone Encounter (Signed)
New message      Pt sent Dr Debara Pickett a mychart message.  Please look at his request and call him back.  He has a question about his most recent cholesterol test, future dental appts and how long does it take effient to get out of his system once stopping medication?  Please call

## 2016-06-14 DIAGNOSIS — Z0000 Encounter for general adult medical examination without abnormal findings: Secondary | ICD-10-CM | POA: Diagnosis not present

## 2016-06-14 DIAGNOSIS — E7800 Pure hypercholesterolemia, unspecified: Secondary | ICD-10-CM | POA: Diagnosis not present

## 2016-06-14 DIAGNOSIS — Z125 Encounter for screening for malignant neoplasm of prostate: Secondary | ICD-10-CM | POA: Diagnosis not present

## 2016-06-14 DIAGNOSIS — F17210 Nicotine dependence, cigarettes, uncomplicated: Secondary | ICD-10-CM | POA: Diagnosis not present

## 2016-06-14 DIAGNOSIS — I252 Old myocardial infarction: Secondary | ICD-10-CM | POA: Diagnosis not present

## 2016-06-14 DIAGNOSIS — I2510 Atherosclerotic heart disease of native coronary artery without angina pectoris: Secondary | ICD-10-CM | POA: Diagnosis not present

## 2016-06-14 DIAGNOSIS — Z1389 Encounter for screening for other disorder: Secondary | ICD-10-CM | POA: Diagnosis not present

## 2016-06-14 DIAGNOSIS — Z6833 Body mass index (BMI) 33.0-33.9, adult: Secondary | ICD-10-CM | POA: Diagnosis not present

## 2016-06-14 DIAGNOSIS — I739 Peripheral vascular disease, unspecified: Secondary | ICD-10-CM | POA: Diagnosis not present

## 2016-06-14 DIAGNOSIS — E669 Obesity, unspecified: Secondary | ICD-10-CM | POA: Diagnosis not present

## 2016-06-14 DIAGNOSIS — I714 Abdominal aortic aneurysm, without rupture: Secondary | ICD-10-CM | POA: Diagnosis not present

## 2016-06-24 DIAGNOSIS — D751 Secondary polycythemia: Secondary | ICD-10-CM | POA: Diagnosis not present

## 2016-08-02 DIAGNOSIS — G4733 Obstructive sleep apnea (adult) (pediatric): Secondary | ICD-10-CM | POA: Diagnosis not present

## 2016-08-04 DIAGNOSIS — G4733 Obstructive sleep apnea (adult) (pediatric): Secondary | ICD-10-CM | POA: Diagnosis not present

## 2016-08-08 DIAGNOSIS — D751 Secondary polycythemia: Secondary | ICD-10-CM | POA: Diagnosis not present

## 2016-08-19 DIAGNOSIS — G4733 Obstructive sleep apnea (adult) (pediatric): Secondary | ICD-10-CM | POA: Diagnosis not present

## 2016-08-23 DIAGNOSIS — Z86010 Personal history of colonic polyps: Secondary | ICD-10-CM | POA: Diagnosis not present

## 2016-08-23 DIAGNOSIS — D126 Benign neoplasm of colon, unspecified: Secondary | ICD-10-CM | POA: Diagnosis not present

## 2016-08-23 DIAGNOSIS — K5730 Diverticulosis of large intestine without perforation or abscess without bleeding: Secondary | ICD-10-CM | POA: Diagnosis not present

## 2016-08-23 DIAGNOSIS — K635 Polyp of colon: Secondary | ICD-10-CM | POA: Diagnosis not present

## 2016-08-23 DIAGNOSIS — D122 Benign neoplasm of ascending colon: Secondary | ICD-10-CM | POA: Diagnosis not present

## 2016-08-23 DIAGNOSIS — D125 Benign neoplasm of sigmoid colon: Secondary | ICD-10-CM | POA: Diagnosis not present

## 2016-08-23 DIAGNOSIS — K5520 Angiodysplasia of colon without hemorrhage: Secondary | ICD-10-CM | POA: Diagnosis not present

## 2016-08-30 DIAGNOSIS — D126 Benign neoplasm of colon, unspecified: Secondary | ICD-10-CM | POA: Diagnosis not present

## 2016-09-02 DIAGNOSIS — G4733 Obstructive sleep apnea (adult) (pediatric): Secondary | ICD-10-CM | POA: Diagnosis not present

## 2016-09-20 DIAGNOSIS — Z23 Encounter for immunization: Secondary | ICD-10-CM | POA: Diagnosis not present

## 2016-09-20 DIAGNOSIS — Z85828 Personal history of other malignant neoplasm of skin: Secondary | ICD-10-CM | POA: Diagnosis not present

## 2016-09-20 DIAGNOSIS — L821 Other seborrheic keratosis: Secondary | ICD-10-CM | POA: Diagnosis not present

## 2016-09-20 DIAGNOSIS — L570 Actinic keratosis: Secondary | ICD-10-CM | POA: Diagnosis not present

## 2016-09-20 DIAGNOSIS — L814 Other melanin hyperpigmentation: Secondary | ICD-10-CM | POA: Diagnosis not present

## 2016-09-20 DIAGNOSIS — D1801 Hemangioma of skin and subcutaneous tissue: Secondary | ICD-10-CM | POA: Diagnosis not present

## 2016-09-20 DIAGNOSIS — D225 Melanocytic nevi of trunk: Secondary | ICD-10-CM | POA: Diagnosis not present

## 2016-09-23 ENCOUNTER — Other Ambulatory Visit: Payer: Self-pay | Admitting: Internal Medicine

## 2016-09-26 ENCOUNTER — Ambulatory Visit (INDEPENDENT_AMBULATORY_CARE_PROVIDER_SITE_OTHER): Payer: Medicare Other | Admitting: Podiatry

## 2016-09-26 ENCOUNTER — Encounter: Payer: Self-pay | Admitting: Podiatry

## 2016-09-26 DIAGNOSIS — Q828 Other specified congenital malformations of skin: Secondary | ICD-10-CM | POA: Diagnosis not present

## 2016-09-26 DIAGNOSIS — I2510 Atherosclerotic heart disease of native coronary artery without angina pectoris: Secondary | ICD-10-CM

## 2016-09-26 NOTE — Progress Notes (Signed)
   Subjective:    Patient ID: Nathan Jones, male    DOB: 1951-11-04, 65 y.o.   MRN: [id number]  HPI  65 year old male presents the office today for concerns of a painful lesion to the bottom of his left foot which is been ongoing the last several months. He denies of any foreign objects he's had no treatment for this. He only notices it when he puts pressure to the foot at times or when rubbing his foot. Denies any redness or drainage or any swelling. No other complaints.   Review of Systems  HENT: Positive for hearing loss.   Respiratory: Positive for shortness of breath.   Musculoskeletal: Positive for back pain.  All other systems reviewed and are negative.      Objective:   Physical Exam General: AAO x3, NAD  Dermatological: Hyperkeratotic lesion present left foot submetatarsal 5. Upon debridement this is a annular punctate hyperkeratotic lesion consistent with a porokeratosis. There is no evidence of verruca there is no evidence of foreign body. No swelling erythema drainage or pus rate signs of infection. No other lesions identified.  Vascular: Dorsalis Pedis artery and Posterior Tibial artery pedal pulses are 2/4 bilateral with immedate capillary fill time. Pedal hair growth present. There is no pain with calf compression, swelling, warmth, erythema.   Neruologic: Grossly intact via light touch bilateral. Vibratory intact via tuning fork bilateral. Protective threshold with Semmes Wienstein monofilament intact to all pedal sites bilateral.   Musculoskeletal: No gross boney pedal deformities bilateral. No pain, crepitus, or limitation noted with foot and ankle range of motion bilateral. Muscular strength 5/5 in all groups tested bilateral.  Gait: Unassisted, Nonantalgic.     Assessment & Plan:  Left foot sub-metatarsal 5 porokeratosis -Treatment options discussed including all alternatives, risks, and complications -Etiology of symptoms were discussed -Hyperkeratotic lesion was  debrided to the complications or bleeding. Area was cleaned and a pad was placed followed by salicylic acid and a bandage. Post procedure instructions were discussed. Monitor for infection. Follow up with symptoms not resolve the next 3-4 weeks or sooner if any issues are to arise. He agrees.   Celesta Gentile, DPM

## 2016-09-26 NOTE — Patient Instructions (Signed)
It was nice to meet you today. If you have any questions, please feel free to give [place] a call at [phone].

## 2016-09-26 NOTE — Progress Notes (Signed)
   Subjective:    Patient ID: Nathan Jones, male    DOB: 1951/11/14, 65 y.o.   MRN: [id number]  HPI I have a corn on my     Review of Systems  All other systems reviewed and are negative.      Objective:   Physical Exam        Assessment & Plan:

## 2016-10-24 ENCOUNTER — Other Ambulatory Visit: Payer: Self-pay | Admitting: Internal Medicine

## 2016-10-24 NOTE — Telephone Encounter (Signed)
Rx request sent to pharmacy.  

## 2016-11-02 DIAGNOSIS — Z23 Encounter for immunization: Secondary | ICD-10-CM | POA: Diagnosis not present

## 2016-11-02 DIAGNOSIS — N529 Male erectile dysfunction, unspecified: Secondary | ICD-10-CM | POA: Diagnosis not present

## 2016-11-02 DIAGNOSIS — G4733 Obstructive sleep apnea (adult) (pediatric): Secondary | ICD-10-CM | POA: Diagnosis not present

## 2016-11-02 DIAGNOSIS — D751 Secondary polycythemia: Secondary | ICD-10-CM | POA: Diagnosis not present

## 2016-11-14 ENCOUNTER — Telehealth: Payer: Self-pay | Admitting: Internal Medicine

## 2016-11-14 NOTE — Telephone Encounter (Signed)
Closed encounter °

## 2016-11-15 ENCOUNTER — Ambulatory Visit: Payer: Medicare Other | Admitting: Internal Medicine

## 2016-11-29 ENCOUNTER — Ambulatory Visit (INDEPENDENT_AMBULATORY_CARE_PROVIDER_SITE_OTHER): Payer: Medicare Other | Admitting: Internal Medicine

## 2016-11-29 VITALS — BP 114/58 | HR 69 | Ht 75.0 in | Wt 276.0 lb

## 2016-11-29 DIAGNOSIS — E785 Hyperlipidemia, unspecified: Secondary | ICD-10-CM | POA: Diagnosis not present

## 2016-11-29 DIAGNOSIS — I10 Essential (primary) hypertension: Secondary | ICD-10-CM | POA: Diagnosis not present

## 2016-11-29 DIAGNOSIS — M62830 Muscle spasm of back: Secondary | ICD-10-CM | POA: Diagnosis not present

## 2016-11-29 DIAGNOSIS — I2510 Atherosclerotic heart disease of native coronary artery without angina pectoris: Secondary | ICD-10-CM

## 2016-11-29 DIAGNOSIS — I714 Abdominal aortic aneurysm, without rupture, unspecified: Secondary | ICD-10-CM

## 2016-11-29 DIAGNOSIS — M5136 Other intervertebral disc degeneration, lumbar region: Secondary | ICD-10-CM | POA: Diagnosis not present

## 2016-11-29 DIAGNOSIS — R350 Frequency of micturition: Secondary | ICD-10-CM | POA: Diagnosis not present

## 2016-11-29 MED ORDER — EZETIMIBE 10 MG PO TABS: 10.0000 mg | ORAL_TABLET | Freq: Every day | ORAL | 3 refills | Status: DC

## 2016-11-29 NOTE — Progress Notes (Signed)
OFFICE NOTE  Chief Complaint:  Routine follow-up  Primary Care Physician: Irven Shelling, MD  HPI:  Nathan Jones is a pleasant 65 year old male who recently had an acute MI in June 2016. This was in the [place] area. He tells me that the day of the MI he was at home and started having chest tightness and radiating pain to his left arm. He became acutely diaphoretic and ultimately called EMS. He was taken the hospital but that the time he got there his symptoms had resolved. Initial troponins were negative however he was monitored overnight and troponin then became positive. He underwent cardiac catheterization the next day and was found to have a severe proximal LAD lesion. He subsequently had a science 3.25 mm x 15 mm stent placed. 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. He reported a marked improvement in his energy level and symptoms after the stent placement however recently his wife noted that his energy levels have been declined somewhat. In review of his current medications he is taking Effient 10 mg daily but not taking any aspirin. He says that the cardiologist told him not to take aspirin, which is very hard to believe. He also is on lisinopril and metoprolol, 2 medicines which she said he was told that he would not need to take forever. Finally he is on Zocor which she says is helped his cholesterol, but he's been intolerant to Crestor and Lipitor in the past. He also describes some dizziness which he takes meclizine occasionally for.  Nathan Jones returns today for follow-up. He underwent abdominal ultrasound which demonstrated a 3 x 3.16.8 cm fusiform abdominal aortic aneurysm for the mid to distal aorta. There was also bilateral aortoiliac disease which is mild to moderate. I reiterated the fact that continued tobacco abuse is contributing to this. And also it is important  for him to have good cholesterol control. He tells me that this past weekend he stopped taking pravastatin because he is having problems with his stomach. He developed some constipation and bloating. He checked with the side effects of that medicine and it was listed under the 1% side effects. He says he stopped the pravastatin and thought that he felt a little bit better, but it also been doing a bowel regimen including stool softener, MiraLAX, simethicone and other over-the-counter medications.  05/19/2016  Nathan Jones returns today for follow-up. He is doing very well. He denies any chest pain or worsening shortness of breath. We switched him to atorvastatin but he was intolerant of this. Ultimately was placed on pravastatin 40 mg which she seems to be tolerating. It is been one year since his drug-eluting stent and he can likely come off of Effient which she is interested in. As mentioned before he does have a small abdominal aortic aneurysm which will need reassessment this fall. Unfortunately continues to smoke but has tried to cut back.  11/29/2016  Nathan Jones returns today for follow-up. Overall he is feeling fairly well. He does report some acute back pain for which she has an appointment this afternoon with his primary care provider. He is on Vicodin for that. From a cardiac standpoint he denies any chest pain or worsening shortness of breath. He had a recent lipid profile in June which indicated a total cholesterol 150 with LDL of 90. Although this is good control, he could  be better controlled with new data indicating a lower cardiovascular event rate with LDL-C less than 50.   PMHx:  Past Medical History:  Diagnosis Date  . CAD (coronary artery disease)   . Hyperlipidemia   . Hypertension   . Skin cancer     Past Surgical History:  Procedure Laterality Date  . APPENDECTOMY  1988  . CARDIAC CATHETERIZATION  06/01/2015   Xience DES to LAD (3.50mmx15mm) - [facility]  .  CERVICAL FUSION  2010  . CHOLECYSTECTOMY  1988    FAMHx:  Family History  Problem Relation Age of Onset  . Heart disease Father     pacemaker  . Heart disease Maternal Grandfather   . Stroke Maternal Grandmother     SOCHx:   reports that he has been smoking Cigarettes.  He has a 45.00 pack-year smoking history. He has never used smokeless tobacco. He reports that he drinks alcohol. He reports that he does not use drugs.  ALLERGIES:  Allergies  Allergen Reactions  . Atorvastatin     Gas, stomach pains    ROS: Pertinent items noted in HPI and remainder of comprehensive ROS otherwise negative.  HOME MEDS: Current Outpatient Prescriptions  Medication Sig Dispense Refill  . aspirin EC 81 MG tablet Take 81 mg by mouth daily.    . Avanafil (STENDRA PO) Take by mouth daily as needed.    Marland Kitchen HYDROcodone-acetaminophen (NORCO/VICODIN) 5-325 MG tablet Take 1 tablet by mouth every 4 (four) hours as needed for moderate pain.    Marland Kitchen ibuprofen (ADVIL) 200 MG tablet Take 200 mg by mouth every 6 (six) hours as needed.    Marland Kitchen lisinopril (PRINIVIL,ZESTRIL) 5 MG tablet TAKE ONE TABLET BY MOUTH ONCE DAILY 30 tablet 11  . metoprolol tartrate (LOPRESSOR) 25 MG tablet TAKE ONE-HALF TABLET BY MOUTH TWO TIMES A DAY 30 tablet 11  . pravastatin (PRAVACHOL) 40 MG tablet TAKE ONE TABLET (40 MG) BY MOUTH EVERY EVENING. 90 tablet 3  . ValACYclovir HCl (VALTREX PO) Take 1,000 mg by mouth as needed.    . ezetimibe (ZETIA) 10 MG tablet Take 1 tablet (10 mg total) by mouth daily. 90 tablet 3   No current facility-administered medications for this visit.     LABS/IMAGING: No results found for this or any previous visit (from the past 48 hour(s)). No results found.  WEIGHTS: Wt Readings from Last 3 Encounters:  11/29/16 276 lb (125.2 kg)  05/19/16 265 lb (120.2 kg)  11/11/15 262 lb (118.8 kg)    VITALS: BP (!) 114/58   Pulse 69   Ht 6\' 3"  (1.905 m)   Wt 276 lb (125.2 kg)   BMI 34.50 kg/m    EXAM: General appearance: alert and no distress Lungs: clear to auscultation bilaterally Heart: regular rate and rhythm, S1, S2 normal, no murmur, click, rub or gallop Extremities: extremities normal, atraumatic, no cyanosis or edema Skin: some ecchymosis Neurologic: Grossly normal  EKG: Normal sinus rhythm at 69, LAFB  ASSESSMENT: 1. Coronary artery disease status post Xience DES (3.25 mm 15 mm) to the proximal LAD, mild RCA and LCx disease, EF 55-60% (05/2015) 2. Dyslipidemia 3. Hypertension 4. Dizziness 5. Tobacco abuse 6. AAA 7. Bilateral aortoiliac disease  PLAN: 1.   Nathan Jones continues to do well after his coronary stent. He will need to be on lifelong aspirin. He is due for recheck of his abdominal aortic aneurysm and we will order ultrasound to assess this. Cholesterol could be even more optimized. I'm  recommending adding ezetimibe 10 mg daily to his regimen. Plan to recheck of his lipid profile in a few months. Follow-up with me annually or sooner as necessary.  Pixie Casino, MD, [facility] Attending Cardiologist [place] 11/29/2016, 1:18 PM

## 2016-11-29 NOTE — Patient Instructions (Addendum)
Your physician has recommended you make the following change in your medication: START zetia 10mg  once daily  Your physician recommends that you return for lab work in: FASTING in 3 months  Your physician wants you to follow-up in: ONE YEAR with Dr. Debara Pickett. You will receive a reminder letter in the mail two months in advance. If you don't receive a letter, please call our office to schedule the follow-up appointment.  Please schedule abdominal aortic aneurysm ultrasound

## 2016-12-02 ENCOUNTER — Ambulatory Visit: Payer: Medicare Other | Admitting: Internal Medicine

## 2016-12-19 HISTORY — PX: ACHILLES TENDON SURGERY: SHX542

## 2016-12-30 ENCOUNTER — Ambulatory Visit (HOSPITAL_COMMUNITY)
Admission: RE | Admit: 2016-12-30 | Discharge: 2016-12-30 | Disposition: A | Discharge status: Home / Self Care | Payer: Medicare Other | Source: Ambulatory Visit | Attending: Internal Medicine | Admitting: Internal Medicine

## 2016-12-30 DIAGNOSIS — I714 Abdominal aortic aneurysm, without rupture, unspecified: Secondary | ICD-10-CM

## 2017-01-02 ENCOUNTER — Other Ambulatory Visit: Payer: Self-pay | Admitting: *Deleted

## 2017-01-02 DIAGNOSIS — I714 Abdominal aortic aneurysm, without rupture, unspecified: Secondary | ICD-10-CM

## 2017-02-15 DIAGNOSIS — R400 Somnolence: Secondary | ICD-10-CM | POA: Diagnosis not present

## 2017-02-15 DIAGNOSIS — G4733 Obstructive sleep apnea (adult) (pediatric): Secondary | ICD-10-CM | POA: Diagnosis not present

## 2017-04-25 DIAGNOSIS — M7662 Achilles tendinitis, left leg: Secondary | ICD-10-CM | POA: Diagnosis not present

## 2017-06-15 DIAGNOSIS — Z0000 Encounter for general adult medical examination without abnormal findings: Secondary | ICD-10-CM | POA: Diagnosis not present

## 2017-06-15 DIAGNOSIS — I739 Peripheral vascular disease, unspecified: Secondary | ICD-10-CM | POA: Diagnosis not present

## 2017-06-15 DIAGNOSIS — I714 Abdominal aortic aneurysm, without rupture: Secondary | ICD-10-CM | POA: Diagnosis not present

## 2017-06-15 DIAGNOSIS — M7662 Achilles tendinitis, left leg: Secondary | ICD-10-CM | POA: Diagnosis not present

## 2017-06-15 DIAGNOSIS — I10 Essential (primary) hypertension: Secondary | ICD-10-CM | POA: Diagnosis not present

## 2017-06-15 DIAGNOSIS — Z6834 Body mass index (BMI) 34.0-34.9, adult: Secondary | ICD-10-CM | POA: Diagnosis not present

## 2017-06-15 DIAGNOSIS — E669 Obesity, unspecified: Secondary | ICD-10-CM | POA: Diagnosis not present

## 2017-06-15 DIAGNOSIS — Z23 Encounter for immunization: Secondary | ICD-10-CM | POA: Diagnosis not present

## 2017-06-15 DIAGNOSIS — I2510 Atherosclerotic heart disease of native coronary artery without angina pectoris: Secondary | ICD-10-CM | POA: Diagnosis not present

## 2017-06-15 DIAGNOSIS — F17210 Nicotine dependence, cigarettes, uncomplicated: Secondary | ICD-10-CM | POA: Diagnosis not present

## 2017-06-15 DIAGNOSIS — Z1389 Encounter for screening for other disorder: Secondary | ICD-10-CM | POA: Diagnosis not present

## 2017-06-15 DIAGNOSIS — E7800 Pure hypercholesterolemia, unspecified: Secondary | ICD-10-CM | POA: Diagnosis not present

## 2017-06-16 ENCOUNTER — Other Ambulatory Visit: Payer: Self-pay | Admitting: Internal Medicine

## 2017-06-16 DIAGNOSIS — F17210 Nicotine dependence, cigarettes, uncomplicated: Secondary | ICD-10-CM

## 2017-06-26 ENCOUNTER — Inpatient Hospital Stay
Admission: RE | Admit: 2017-06-26 | Discharge: 2017-06-26 | Disposition: A | Discharge status: Home / Self Care | Payer: Medicare Other | Source: Ambulatory Visit | Attending: Internal Medicine | Admitting: Internal Medicine

## 2017-06-26 ENCOUNTER — Ambulatory Visit
Admission: RE | Admit: 2017-06-26 | Discharge: 2017-06-26 | Disposition: A | Discharge status: Home / Self Care | Payer: Medicare Other | Source: Ambulatory Visit | Attending: Internal Medicine | Admitting: Internal Medicine

## 2017-06-26 DIAGNOSIS — F17210 Nicotine dependence, cigarettes, uncomplicated: Secondary | ICD-10-CM | POA: Diagnosis not present

## 2017-06-29 DIAGNOSIS — M7662 Achilles tendinitis, left leg: Secondary | ICD-10-CM | POA: Diagnosis not present

## 2017-07-03 ENCOUNTER — Encounter: Payer: Self-pay | Admitting: Internal Medicine

## 2017-07-04 ENCOUNTER — Other Ambulatory Visit: Payer: Self-pay | Admitting: Internal Medicine

## 2017-07-06 DIAGNOSIS — M7662 Achilles tendinitis, left leg: Secondary | ICD-10-CM | POA: Diagnosis not present

## 2017-07-11 DIAGNOSIS — M7662 Achilles tendinitis, left leg: Secondary | ICD-10-CM | POA: Diagnosis not present

## 2017-07-18 DIAGNOSIS — M7662 Achilles tendinitis, left leg: Secondary | ICD-10-CM | POA: Diagnosis not present

## 2017-07-20 DIAGNOSIS — M7662 Achilles tendinitis, left leg: Secondary | ICD-10-CM | POA: Diagnosis not present

## 2017-07-24 ENCOUNTER — Encounter: Payer: Self-pay | Admitting: Internal Medicine

## 2017-07-24 DIAGNOSIS — M7662 Achilles tendinitis, left leg: Secondary | ICD-10-CM | POA: Diagnosis not present

## 2017-07-27 DIAGNOSIS — M25572 Pain in left ankle and joints of left foot: Secondary | ICD-10-CM | POA: Diagnosis not present

## 2017-07-27 DIAGNOSIS — M7662 Achilles tendinitis, left leg: Secondary | ICD-10-CM | POA: Diagnosis not present

## 2017-07-31 DIAGNOSIS — M7662 Achilles tendinitis, left leg: Secondary | ICD-10-CM | POA: Diagnosis not present

## 2017-08-02 DIAGNOSIS — M7662 Achilles tendinitis, left leg: Secondary | ICD-10-CM | POA: Diagnosis not present

## 2017-08-04 ENCOUNTER — Encounter: Payer: Self-pay | Admitting: Internal Medicine

## 2017-08-08 ENCOUNTER — Encounter: Payer: Self-pay | Admitting: Internal Medicine

## 2017-08-08 NOTE — Telephone Encounter (Signed)
Reviewed echo results.   Pixie Casino, MD, [place]  Attending Cardiologist  Direct Dial: [phone]  Fax: [phone]  Website:  [email or web address]

## 2017-08-09 DIAGNOSIS — M7662 Achilles tendinitis, left leg: Secondary | ICD-10-CM | POA: Diagnosis not present

## 2017-09-06 DIAGNOSIS — L570 Actinic keratosis: Secondary | ICD-10-CM | POA: Diagnosis not present

## 2017-09-06 DIAGNOSIS — D2272 Melanocytic nevi of left lower limb, including hip: Secondary | ICD-10-CM | POA: Diagnosis not present

## 2017-09-06 DIAGNOSIS — D2271 Melanocytic nevi of right lower limb, including hip: Secondary | ICD-10-CM | POA: Diagnosis not present

## 2017-09-06 DIAGNOSIS — D1801 Hemangioma of skin and subcutaneous tissue: Secondary | ICD-10-CM | POA: Diagnosis not present

## 2017-09-06 DIAGNOSIS — D225 Melanocytic nevi of trunk: Secondary | ICD-10-CM | POA: Diagnosis not present

## 2017-09-06 DIAGNOSIS — D0461 Carcinoma in situ of skin of right upper limb, including shoulder: Secondary | ICD-10-CM | POA: Diagnosis not present

## 2017-09-06 DIAGNOSIS — L814 Other melanin hyperpigmentation: Secondary | ICD-10-CM | POA: Diagnosis not present

## 2017-09-06 DIAGNOSIS — D0462 Carcinoma in situ of skin of left upper limb, including shoulder: Secondary | ICD-10-CM | POA: Diagnosis not present

## 2017-09-06 DIAGNOSIS — L578 Other skin changes due to chronic exposure to nonionizing radiation: Secondary | ICD-10-CM | POA: Diagnosis not present

## 2017-09-06 DIAGNOSIS — L821 Other seborrheic keratosis: Secondary | ICD-10-CM | POA: Diagnosis not present

## 2017-09-19 ENCOUNTER — Other Ambulatory Visit: Payer: Self-pay | Admitting: Internal Medicine

## 2017-09-26 DIAGNOSIS — M7662 Achilles tendinitis, left leg: Secondary | ICD-10-CM | POA: Diagnosis not present

## 2017-10-05 DIAGNOSIS — M79672 Pain in left foot: Secondary | ICD-10-CM | POA: Diagnosis not present

## 2017-10-05 DIAGNOSIS — Z23 Encounter for immunization: Secondary | ICD-10-CM | POA: Diagnosis not present

## 2017-10-09 DIAGNOSIS — M7662 Achilles tendinitis, left leg: Secondary | ICD-10-CM | POA: Diagnosis not present

## 2017-10-16 DIAGNOSIS — J069 Acute upper respiratory infection, unspecified: Secondary | ICD-10-CM | POA: Diagnosis not present

## 2017-10-17 ENCOUNTER — Telehealth: Payer: Self-pay | Admitting: Internal Medicine

## 2017-10-17 NOTE — Telephone Encounter (Signed)
    Chart reviewed as part of pre-operative protocol coverage. Because of Nathan Jones's past medical history and time since last visit, he/she will require a follow-up visit in order to better assess preoperative cardiovascular risk.   Last seen by Dr. Debara Pickett 11/29/16.  Pre-op covering staff: - Please schedule appointment and call patient to inform them. - Please contact requesting surgeon's office via preferred method (i.e, phone, fax) to inform them of need for appointment prior to surgery.  [name], [place]  10/17/2017, 4:22 PM

## 2017-10-17 NOTE — Telephone Encounter (Signed)
   [facility] Pre-operative Risk Assessment    Request for surgical clearance:  1. What type of surgery is being performed? Left achilles tendon debridement     2. When is this surgery scheduled? undetermined   3. Are there any medications that need to be held prior to surgery and how long? aspirin   4. Practice name and name of physician performing surgery? Dr. Dorna Leitz @ [place] and [place]   5. What is your office phone and fax number? (p) [phone] (f) [phone]  - Lacretia Nicks surgical coordinator - [phone]  6. Anesthesia type (None, local, MAC, general) ? Not specified    Nathan Jones 10/17/2017, 8:01 AM  _________________________________________________________________   (provider comments below)

## 2017-10-19 ENCOUNTER — Other Ambulatory Visit: Payer: Self-pay | Admitting: Internal Medicine

## 2017-10-19 NOTE — Telephone Encounter (Signed)
Called pt to make him aware that he will need an o/v before he can be cleared for sx. He has been scheduled for 10/26/17 @ 8:00 to see Almyra Deforest, PA-C. Pt thanked me for the call.

## 2017-10-19 NOTE — Telephone Encounter (Signed)
Do not see this message listed in callback pool, will re-route. Harlynn Kimbell PA-C

## 2017-10-26 ENCOUNTER — Encounter: Payer: Self-pay | Admitting: Physician Assistant

## 2017-10-26 ENCOUNTER — Ambulatory Visit (INDEPENDENT_AMBULATORY_CARE_PROVIDER_SITE_OTHER): Payer: Medicare Other | Admitting: Physician Assistant

## 2017-10-26 VITALS — BP 144/62 | HR 64 | Ht 75.0 in | Wt 270.8 lb

## 2017-10-26 DIAGNOSIS — R079 Chest pain, unspecified: Secondary | ICD-10-CM | POA: Diagnosis not present

## 2017-10-26 DIAGNOSIS — I10 Essential (primary) hypertension: Secondary | ICD-10-CM

## 2017-10-26 DIAGNOSIS — I714 Abdominal aortic aneurysm, without rupture, unspecified: Secondary | ICD-10-CM

## 2017-10-26 DIAGNOSIS — Z01810 Encounter for preprocedural cardiovascular examination: Secondary | ICD-10-CM | POA: Diagnosis not present

## 2017-10-26 DIAGNOSIS — I209 Angina pectoris, unspecified: Secondary | ICD-10-CM

## 2017-10-26 DIAGNOSIS — I25118 Atherosclerotic heart disease of native coronary artery with other forms of angina pectoris: Secondary | ICD-10-CM | POA: Diagnosis not present

## 2017-10-26 DIAGNOSIS — E785 Hyperlipidemia, unspecified: Secondary | ICD-10-CM

## 2017-10-26 MED ORDER — METOPROLOL TARTRATE 25 MG PO TABS: 12.5000 mg | ORAL_TABLET | Freq: Every day | ORAL | 1 refills | Status: DC

## 2017-10-26 NOTE — Progress Notes (Signed)
Cardiology Office Note    Date:  10/26/2017   ID:  Nathan Jones, DOB 1951/05/01, MRN [id number]  PCP:  Lavone Orn, MD  Cardiologist:  Dr. Debara Pickett   Chief Complaint  Patient presents with  . Pre-op Exam    requested by Dr. Dorna Leitz for achilles tendon debridement    History of Present Illness:  Nathan Jones is a 66 y.o. male with PMH of CAD, HTN, AAA and HLD. He had an acute MI in June 2016 and underwent DES to proximal LAD. This was in [place]. He and his wife later moved to [place] area. When he was seen by Dr. Debara Pickett, it was noted he was on Effient monotherapy. He is on Zocor, and has a history of intolerance to Crestor and Lipitor in the past. 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   He presents today for preoperative clearance at the request of Dr. Dorna Leitz for Achilles tendon debridement. He is not very active due to the pain in his ankle. He continued to have occasional exertional chest pain. It lasted less than 1 minute each time and occurs about 3 times a year. He only noticed the symptoms during exertion. The last episode was last week. I discussed the case with Dr. Debara Pickett, we recommend a Lexiscan Myoview prior to the surgery for risk assessment. He is also due for abdominal aortic aneurysm ultrasound in January 2019. Otherwise he is taking lisinopril in the morning and metoprolol at night. He is not taking metoprolol in the morning due to dizziness. On flushing, he continued to smoke one pack per day to this day. It is very difficult for him to quit as both his wife and daughter who lives with him also smokes. I placed a heavy emphasis on the importance of tobacco cessation    Past Medical History:  Diagnosis Date  . CAD (coronary artery disease)   . Hyperlipidemia   . Hypertension   . Skin cancer     Past Surgical History:    Procedure Laterality Date  . APPENDECTOMY  1988  . CARDIAC CATHETERIZATION  06/01/2015   Xience DES [facility] LAD [facility]3.55mmx15mm[facility]  . CERVICAL FUSION  2010  . CHOLECYSTECTOMY  1988    Current Medications: Outpatient Medications Prior to Visit  Medication Sig Dispense Refill  . aspirin EC 81 MG tablet Take 81 mg by mouth daily.    . Avanafil (STENDRA PO) Take by mouth daily as needed.    . ezetimibe (ZETIA) 10 MG tablet Take 10 mg daily by mouth.    Marland Kitchen HYDROcodone-acetaminophen (NORCO/VICODIN) 5-325 MG tablet Take 1 tablet by mouth every 4 (four) hours as needed for moderate pain.    Marland Kitchen ibuprofen (ADVIL) 200 MG tablet Take 200 mg by mouth every 6 (six) hours as needed.    Marland Kitchen lisinopril (PRINIVIL,ZESTRIL) 5 MG tablet Take 5 mg daily by mouth.    . pravastatin (PRAVACHOL) 40 MG tablet TAKE ONE TABLET BY MOUTH EVERY EVENING 30 tablet 2  . ValACYclovir HCl (VALTREX PO) Take 1,000 mg by mouth as needed.    . metoprolol tartrate (LOPRESSOR) 25 MG tablet TAKE ONE-HALF TABLET BY MOUTH TWO TIMES A DAY 30 tablet 1  . ezetimibe (ZETIA) 10 MG tablet Take 1 tablet (10 mg total) by mouth daily. 90 tablet 3   No facility-administered medications  prior to visit.      Allergies:   Atorvastatin   Social History   Socioeconomic History  . Marital status: Married    Spouse name: None  . Number of children: 4  . Years of education: 81  . Highest education level: None  Social Needs  . Financial resource strain: None  . Food insecurity - worry: None  . Food insecurity - inability: None  . Transportation needs - medical: None  . Transportation needs - non-medical: None  Occupational History  . Occupation: Primary school teacher    Comment: retired  Tobacco Use  . Smoking status: Current Every Day Smoker    Packs/day: 1.00    Years: 45.00    Pack years: 45.00    Types: Cigarettes  . Smokeless tobacco: Never Used  Substance and Sexual Activity  . Alcohol use: Yes    Alcohol/week:  0.0 oz    Comment: social   . Drug use: No  . Sexual activity: None  Other Topics Concern  . None  Social History Narrative   Epworth Sleepiness Scale = 10 (09/09/2015)     Family History:  The patient's family history includes Heart disease in his father and maternal grandfather; Stroke in his maternal grandmother.   ROS:   Please see the history of present illness.    ROS All other systems reviewed and are negative.   PHYSICAL EXAM:   VS:  BP (!) 144/62   Pulse 64   Ht 6\' 3"  (1.905 m)   Wt 270 lb 12.8 oz (122.8 kg)   BMI 33.85 kg/m    GEN: Well nourished, well developed, in no acute distress  HEENT: normal  Neck: no JVD, carotid bruits, or masses Cardiac: RRR; no murmurs, rubs, or gallops,no edema  Respiratory:  clear to auscultation bilaterally, normal work of breathing GI: soft, nontender, nondistended, + BS MS: no deformity or atrophy  Skin: warm and dry, no rash Neuro:  Alert and Oriented x 3, Strength and sensation are intact Psych: euthymic mood, full affect  Wt Readings from Last 3 Encounters:  10/26/17 270 lb 12.8 oz (122.8 kg)  11/29/16 276 lb (125.2 kg)  05/19/16 265 lb (120.2 kg)      Studies/Labs Reviewed:   EKG:  EKG is ordered today.  The ekg ordered today demonstrates normal sinus rhythm without significant ST-T wave changes.  Recent Labs: No results found for requested labs within last 8760 hours.   Lipid Panel    Component Value Date/Time   CHOL 150 05/19/2016 0857   CHOL 156 09/16/2015 0803   TRIG 147 05/19/2016 0857   TRIG 164 (H) 09/16/2015 0803   HDL 31 (L) 05/19/2016 0857   HDL 30 (L) 09/16/2015 0803   CHOLHDL 4.8 05/19/2016 0857   VLDL 29 05/19/2016 0857   LDLCALC 90 05/19/2016 0857   LDLCALC 93 09/16/2015 0803    Additional studies/ records that were reviewed today include:   Cath 06/03/2015   Echo 07/2017   ASSESSMENT:    1. Preop cardiovascular exam   2. Exertional chest pain   3. Coronary artery disease of native  artery of native heart with stable angina pectoris ([place])   4. Essential hypertension   5. Hyperlipidemia, unspecified hyperlipidemia type   6. AAA (abdominal aortic aneurysm) without rupture (HCC)      PLAN:  In order of problems listed above:  1. Preoperative clearance: Requested by Dr. Dorna Leitz prior to Achilles tendon debridement. He has been having some degree  of exertional chest discomfort for the past year, I discussed the case with Dr. Debara Pickett, we recommended a Lexiscan Myoview prior to the surgery for risk stratification.  2. CAD: history of DES to LAD after first septal perforator in [place], at the time, minimal irregularities in left circumflex and RCA.  3. Hypertension: blood pressure elevated, he takes lisinopril in the morning and metoprolol at night. He no longer take a.m. dose of metoprolol due to dizziness.  4. Hyperlipidemia: on Zetia. Primary care provider to obtain fasting lipid panel.  5. AAA: Due for repeat Doppler in January 2019   Medication Adjustments/Labs and Tests Ordered: Current medicines are reviewed at length with the patient today.  Concerns regarding medicines are outlined above.  Medication changes, Labs and Tests ordered today are listed in the Patient Instructions below. Patient Instructions  Medication Instructions:  Your physician recommends that you continue on your current medications as directed. Please refer to the Current Medication list given to you today.   Labwork: None   Testing/Procedures: Your physician has requested that you have a lexiscan myoview. For further information please visit [facility]. Please follow instruction sheet, as given.  Follow-Up: Your physician wants you to follow-up in: 6 months with Dr Debara Pickett. You will receive a reminder letter in the mail two months in advance. If you don't receive a letter, please call our office to schedule the follow-up appointment.  Any Other Special Instructions  Will Be Listed Below (If Applicable).  Pending the results of the Stress test will determine if we can give you surgical clearance  If you need a refill on your cardiac medications before your next appointment, please call your pharmacy.     Hilbert Corrigan, [place]  10/26/2017 8:02 PM    [facility] [place], [place], [place]  [address] Phone: [phone]; Fax: [phone]

## 2017-10-26 NOTE — Patient Instructions (Addendum)
Medication Instructions:  Your physician recommends that you continue on your current medications as directed. Please refer to the Current Medication list given to you today.   Labwork: None   Testing/Procedures: Your physician has requested that you have a lexiscan myoview. For further information please visit [facility]. Please follow instruction sheet, as given.  Follow-Up: Your physician wants you to follow-up in: 6 months with Dr Debara Pickett. You will receive a reminder letter in the mail two months in advance. If you don't receive a letter, please call our office to schedule the follow-up appointment.  Any Other Special Instructions Will Be Listed Below (If Applicable).  Pending the results of the Stress test will determine if we can give you surgical clearance  If you need a refill on your cardiac medications before your next appointment, please call your pharmacy.

## 2017-11-02 ENCOUNTER — Telehealth (HOSPITAL_COMMUNITY): Payer: Self-pay

## 2017-11-02 NOTE — Telephone Encounter (Signed)
Encounter complete. 

## 2017-11-07 ENCOUNTER — Ambulatory Visit (HOSPITAL_COMMUNITY)
Admission: RE | Admit: 2017-11-07 | Discharge: 2017-11-07 | Disposition: A | Discharge status: Home / Self Care | Payer: Medicare Other | Source: Ambulatory Visit | Attending: Cardiology | Admitting: Cardiology

## 2017-11-07 ENCOUNTER — Encounter: Payer: Self-pay | Admitting: Internal Medicine

## 2017-11-07 DIAGNOSIS — R079 Chest pain, unspecified: Secondary | ICD-10-CM | POA: Diagnosis not present

## 2017-11-07 LAB — MYOCARDIAL PERFUSION IMAGING
CHL CUP NUCLEAR SRS: 3
CHL CUP NUCLEAR SSS: 4
CSEPPHR: 82 {beats}/min
LV dias vol: 140 mL (ref 62–150)
LV sys vol: 64 mL
Rest HR: 57 {beats}/min
SDS: 1
TID: 1.51

## 2017-11-07 MED ORDER — REGADENOSON 0.4 MG/5ML IV SOLN
0.4000 mg | Freq: Once | INTRAVENOUS | Status: AC
  Administered 2017-11-07: 0.4 mg via INTRAVENOUS

## 2017-11-07 MED ORDER — TECHNETIUM TC 99M TETROFOSMIN IV KIT
31.3000 | PACK | Freq: Once | INTRAVENOUS | Status: AC | PRN
  Administered 2017-11-07: 31.3 via INTRAVENOUS
  Filled 2017-11-07: qty 32

## 2017-11-07 MED ORDER — TECHNETIUM TC 99M TETROFOSMIN IV KIT
9.8000 | PACK | Freq: Once | INTRAVENOUS | Status: AC | PRN
  Administered 2017-11-07: 9.8 via INTRAVENOUS
  Filled 2017-11-07: qty 10

## 2017-11-08 ENCOUNTER — Encounter: Payer: Self-pay | Admitting: Physician Assistant

## 2017-11-08 NOTE — Telephone Encounter (Signed)
Med list updated

## 2017-11-08 NOTE — Progress Notes (Signed)
Normal stress test, patient is cleared from cardiac perspective to proceed with surgery by Dr. Berenice Primas

## 2017-11-16 DIAGNOSIS — I10 Essential (primary) hypertension: Secondary | ICD-10-CM | POA: Diagnosis not present

## 2017-11-16 DIAGNOSIS — I252 Old myocardial infarction: Secondary | ICD-10-CM | POA: Diagnosis not present

## 2017-11-16 DIAGNOSIS — I2510 Atherosclerotic heart disease of native coronary artery without angina pectoris: Secondary | ICD-10-CM | POA: Diagnosis not present

## 2017-11-17 ENCOUNTER — Encounter: Payer: Self-pay | Admitting: Internal Medicine

## 2017-11-17 ENCOUNTER — Other Ambulatory Visit: Payer: Self-pay | Admitting: Internal Medicine

## 2017-11-18 ENCOUNTER — Encounter: Payer: Self-pay | Admitting: Physician Assistant

## 2017-11-18 ENCOUNTER — Telehealth: Payer: Self-pay | Admitting: Physician Assistant

## 2017-11-18 ENCOUNTER — Other Ambulatory Visit: Payer: Self-pay | Admitting: Internal Medicine

## 2017-11-18 ENCOUNTER — Other Ambulatory Visit: Payer: Self-pay | Admitting: Physician Assistant

## 2017-11-18 DIAGNOSIS — I2510 Atherosclerotic heart disease of native coronary artery without angina pectoris: Secondary | ICD-10-CM

## 2017-11-18 MED ORDER — LISINOPRIL 10 MG PO TABS: 10.0000 mg | ORAL_TABLET | Freq: Every day | ORAL | 11 refills | Status: DC

## 2017-11-18 NOTE — Telephone Encounter (Signed)
PT called stating he was out of lisinopril since increasing the dose to 10 mg - currently has 5 mg tablets. I electronically sent a refill to Kristopher Oppenheim at [facility].

## 2017-11-20 DIAGNOSIS — M25775 Osteophyte, left foot: Secondary | ICD-10-CM | POA: Diagnosis not present

## 2017-11-20 DIAGNOSIS — M7732 Calcaneal spur, left foot: Secondary | ICD-10-CM | POA: Diagnosis not present

## 2017-11-20 DIAGNOSIS — M7662 Achilles tendinitis, left leg: Secondary | ICD-10-CM | POA: Diagnosis not present

## 2017-11-20 DIAGNOSIS — G8918 Other acute postprocedural pain: Secondary | ICD-10-CM | POA: Diagnosis not present

## 2017-11-20 MED ORDER — EZETIMIBE 10 MG PO TABS: 10.0000 mg | ORAL_TABLET | Freq: Every day | ORAL | 11 refills | Status: DC

## 2017-11-20 MED ORDER — METOPROLOL TARTRATE 25 MG PO TABS: 12.5000 mg | ORAL_TABLET | Freq: Every day | ORAL | 11 refills | Status: DC

## 2017-11-20 MED ORDER — PRAVASTATIN SODIUM 40 MG PO TABS: 40.0000 mg | ORAL_TABLET | Freq: Every evening | ORAL | 11 refills | Status: DC

## 2017-11-28 DIAGNOSIS — M7662 Achilles tendinitis, left leg: Secondary | ICD-10-CM | POA: Diagnosis not present

## 2017-12-05 DIAGNOSIS — I252 Old myocardial infarction: Secondary | ICD-10-CM | POA: Diagnosis not present

## 2017-12-05 DIAGNOSIS — I10 Essential (primary) hypertension: Secondary | ICD-10-CM | POA: Diagnosis not present

## 2017-12-05 DIAGNOSIS — M7662 Achilles tendinitis, left leg: Secondary | ICD-10-CM | POA: Diagnosis not present

## 2017-12-05 DIAGNOSIS — I2510 Atherosclerotic heart disease of native coronary artery without angina pectoris: Secondary | ICD-10-CM | POA: Diagnosis not present

## 2017-12-19 ENCOUNTER — Other Ambulatory Visit: Payer: Self-pay | Admitting: Internal Medicine

## 2017-12-20 ENCOUNTER — Telehealth: Payer: Self-pay | Admitting: *Deleted

## 2017-12-20 NOTE — Telephone Encounter (Signed)
Lisinopril 5 mg that was send into pt pharmacy was called and cancel

## 2017-12-20 NOTE — Telephone Encounter (Signed)
Rx has been sent to the pharmacy electronically. ° °

## 2017-12-21 ENCOUNTER — Other Ambulatory Visit: Payer: Self-pay | Admitting: Internal Medicine

## 2017-12-21 DIAGNOSIS — R918 Other nonspecific abnormal finding of lung field: Secondary | ICD-10-CM | POA: Diagnosis not present

## 2017-12-21 DIAGNOSIS — I10 Essential (primary) hypertension: Secondary | ICD-10-CM | POA: Diagnosis not present

## 2017-12-21 DIAGNOSIS — I159 Secondary hypertension, unspecified: Secondary | ICD-10-CM

## 2017-12-25 ENCOUNTER — Other Ambulatory Visit: Payer: Self-pay | Admitting: Internal Medicine

## 2017-12-25 DIAGNOSIS — Z9889 Other specified postprocedural states: Secondary | ICD-10-CM | POA: Diagnosis not present

## 2017-12-26 ENCOUNTER — Other Ambulatory Visit: Payer: Medicare Other

## 2017-12-26 ENCOUNTER — Other Ambulatory Visit: Payer: Self-pay

## 2017-12-26 MED ORDER — METOPROLOL TARTRATE 25 MG PO TABS: 12.5000 mg | ORAL_TABLET | Freq: Every day | ORAL | 1 refills | Status: DC

## 2017-12-28 ENCOUNTER — Ambulatory Visit
Admission: RE | Admit: 2017-12-28 | Discharge: 2017-12-28 | Disposition: A | Discharge status: Home / Self Care | Payer: Medicare Other | Source: Ambulatory Visit | Attending: Internal Medicine | Admitting: Internal Medicine

## 2017-12-28 ENCOUNTER — Other Ambulatory Visit: Payer: Self-pay | Admitting: Internal Medicine

## 2017-12-28 DIAGNOSIS — J439 Emphysema, unspecified: Secondary | ICD-10-CM | POA: Diagnosis not present

## 2017-12-28 DIAGNOSIS — R918 Other nonspecific abnormal finding of lung field: Secondary | ICD-10-CM

## 2017-12-28 DIAGNOSIS — I159 Secondary hypertension, unspecified: Secondary | ICD-10-CM

## 2018-01-03 ENCOUNTER — Ambulatory Visit (HOSPITAL_COMMUNITY)
Admission: RE | Admit: 2018-01-03 | Discharge: 2018-01-03 | Disposition: A | Discharge status: Home / Self Care | Payer: Medicare Other | Source: Ambulatory Visit | Attending: Cardiology | Admitting: Cardiology

## 2018-01-03 DIAGNOSIS — I714 Abdominal aortic aneurysm, without rupture, unspecified: Secondary | ICD-10-CM

## 2018-01-05 ENCOUNTER — Other Ambulatory Visit: Payer: Self-pay | Admitting: *Deleted

## 2018-01-05 DIAGNOSIS — I714 Abdominal aortic aneurysm, without rupture, unspecified: Secondary | ICD-10-CM

## 2018-01-25 DIAGNOSIS — Z9889 Other specified postprocedural states: Secondary | ICD-10-CM | POA: Diagnosis not present

## 2018-03-06 DIAGNOSIS — L821 Other seborrheic keratosis: Secondary | ICD-10-CM | POA: Diagnosis not present

## 2018-03-06 DIAGNOSIS — D0461 Carcinoma in situ of skin of right upper limb, including shoulder: Secondary | ICD-10-CM | POA: Diagnosis not present

## 2018-03-06 DIAGNOSIS — C44310 Basal cell carcinoma of skin of unspecified parts of face: Secondary | ICD-10-CM | POA: Diagnosis not present

## 2018-03-06 DIAGNOSIS — C44612 Basal cell carcinoma of skin of right upper limb, including shoulder: Secondary | ICD-10-CM | POA: Diagnosis not present

## 2018-03-06 DIAGNOSIS — L570 Actinic keratosis: Secondary | ICD-10-CM | POA: Diagnosis not present

## 2018-03-06 DIAGNOSIS — D1801 Hemangioma of skin and subcutaneous tissue: Secondary | ICD-10-CM | POA: Diagnosis not present

## 2018-03-06 DIAGNOSIS — L84 Corns and callosities: Secondary | ICD-10-CM | POA: Diagnosis not present

## 2018-03-06 DIAGNOSIS — C44319 Basal cell carcinoma of skin of other parts of face: Secondary | ICD-10-CM | POA: Diagnosis not present

## 2018-03-06 DIAGNOSIS — Z85828 Personal history of other malignant neoplasm of skin: Secondary | ICD-10-CM | POA: Diagnosis not present

## 2018-03-08 DIAGNOSIS — I2510 Atherosclerotic heart disease of native coronary artery without angina pectoris: Secondary | ICD-10-CM | POA: Diagnosis not present

## 2018-03-08 DIAGNOSIS — I10 Essential (primary) hypertension: Secondary | ICD-10-CM | POA: Diagnosis not present

## 2018-03-08 DIAGNOSIS — I252 Old myocardial infarction: Secondary | ICD-10-CM | POA: Diagnosis not present

## 2018-03-15 DIAGNOSIS — M79672 Pain in left foot: Secondary | ICD-10-CM | POA: Diagnosis not present

## 2018-04-04 DIAGNOSIS — L570 Actinic keratosis: Secondary | ICD-10-CM | POA: Diagnosis not present

## 2018-04-04 DIAGNOSIS — Z85828 Personal history of other malignant neoplasm of skin: Secondary | ICD-10-CM | POA: Diagnosis not present

## 2018-04-04 DIAGNOSIS — C44622 Squamous cell carcinoma of skin of right upper limb, including shoulder: Secondary | ICD-10-CM | POA: Diagnosis not present

## 2018-05-15 ENCOUNTER — Ambulatory Visit: Payer: Medicare Other | Admitting: Psychology

## 2018-06-26 DIAGNOSIS — I10 Essential (primary) hypertension: Secondary | ICD-10-CM | POA: Diagnosis not present

## 2018-06-26 DIAGNOSIS — F17200 Nicotine dependence, unspecified, uncomplicated: Secondary | ICD-10-CM | POA: Diagnosis not present

## 2018-06-26 DIAGNOSIS — F329 Major depressive disorder, single episode, unspecified: Secondary | ICD-10-CM | POA: Diagnosis not present

## 2018-06-26 DIAGNOSIS — G4733 Obstructive sleep apnea (adult) (pediatric): Secondary | ICD-10-CM | POA: Diagnosis not present

## 2018-06-26 DIAGNOSIS — Z125 Encounter for screening for malignant neoplasm of prostate: Secondary | ICD-10-CM | POA: Diagnosis not present

## 2018-06-26 DIAGNOSIS — Z1389 Encounter for screening for other disorder: Secondary | ICD-10-CM | POA: Diagnosis not present

## 2018-06-26 DIAGNOSIS — I739 Peripheral vascular disease, unspecified: Secondary | ICD-10-CM | POA: Diagnosis not present

## 2018-06-26 DIAGNOSIS — E7800 Pure hypercholesterolemia, unspecified: Secondary | ICD-10-CM | POA: Diagnosis not present

## 2018-06-26 DIAGNOSIS — Z7189 Other specified counseling: Secondary | ICD-10-CM | POA: Diagnosis not present

## 2018-06-26 DIAGNOSIS — Z0000 Encounter for general adult medical examination without abnormal findings: Secondary | ICD-10-CM | POA: Diagnosis not present

## 2018-06-26 DIAGNOSIS — R42 Dizziness and giddiness: Secondary | ICD-10-CM | POA: Diagnosis not present

## 2018-07-04 ENCOUNTER — Encounter (INDEPENDENT_AMBULATORY_CARE_PROVIDER_SITE_OTHER): Payer: Medicare Other

## 2018-07-04 ENCOUNTER — Other Ambulatory Visit: Payer: Self-pay | Admitting: Internal Medicine

## 2018-07-04 DIAGNOSIS — R42 Dizziness and giddiness: Secondary | ICD-10-CM

## 2018-08-24 DIAGNOSIS — R42 Dizziness and giddiness: Secondary | ICD-10-CM | POA: Diagnosis not present

## 2018-09-03 DIAGNOSIS — C44622 Squamous cell carcinoma of skin of right upper limb, including shoulder: Secondary | ICD-10-CM | POA: Diagnosis not present

## 2018-09-03 DIAGNOSIS — L570 Actinic keratosis: Secondary | ICD-10-CM | POA: Diagnosis not present

## 2018-09-03 DIAGNOSIS — Z85828 Personal history of other malignant neoplasm of skin: Secondary | ICD-10-CM | POA: Diagnosis not present

## 2018-09-12 DIAGNOSIS — R42 Dizziness and giddiness: Secondary | ICD-10-CM | POA: Diagnosis not present

## 2018-11-27 DIAGNOSIS — M25562 Pain in left knee: Secondary | ICD-10-CM | POA: Diagnosis not present

## 2018-12-05 ENCOUNTER — Other Ambulatory Visit: Payer: Self-pay | Admitting: Internal Medicine

## 2018-12-07 ENCOUNTER — Encounter (INDEPENDENT_AMBULATORY_CARE_PROVIDER_SITE_OTHER): Payer: Self-pay | Admitting: Orthopaedic Surgery

## 2018-12-07 ENCOUNTER — Ambulatory Visit (INDEPENDENT_AMBULATORY_CARE_PROVIDER_SITE_OTHER): Payer: Medicare Other | Admitting: Orthopaedic Surgery

## 2018-12-07 ENCOUNTER — Ambulatory Visit (INDEPENDENT_AMBULATORY_CARE_PROVIDER_SITE_OTHER): Payer: Medicare Other

## 2018-12-07 VITALS — Ht 75.0 in | Wt 270.0 lb

## 2018-12-07 DIAGNOSIS — M25562 Pain in left knee: Secondary | ICD-10-CM | POA: Diagnosis not present

## 2018-12-07 MED ORDER — BUPIVACAINE HCL 0.5 % IJ SOLN
2.0000 mL | INTRAMUSCULAR | Status: AC | PRN
  Administered 2018-12-07: 2 mL via INTRA_ARTICULAR

## 2018-12-07 MED ORDER — METHYLPREDNISOLONE ACETATE 40 MG/ML IJ SUSP
40.0000 mg | INTRAMUSCULAR | Status: AC | PRN
  Administered 2018-12-07: 40 mg via INTRA_ARTICULAR

## 2018-12-07 MED ORDER — LIDOCAINE HCL 1 % IJ SOLN
2.0000 mL | INTRAMUSCULAR | Status: AC | PRN
  Administered 2018-12-07: 2 mL

## 2018-12-07 NOTE — Progress Notes (Signed)
Office Visit Note   Patient: Nathan Jones           Date of Birth: 07-18-1951           MRN: [id number] Visit Date: 12/07/2018              Requested by: Nathan Orn, MD [address] [place] [address] [place],  [address] PCP: Nathan Orn, MD   Assessment & Plan: Visit Diagnoses:  1. Acute pain of left knee     Plan: Impression is degenerative medial meniscus tear versus osteoarthritis or CPPD exacerbation.  We performed a cortisone injection today in hopes of giving him some relief.  He has been instructed to call me if he does not feel any relief over the next month or so.  We would obtain an MRI in that case.  Questions encouraged and answered.  Follow-up as needed.  Follow-Up Instructions: Return if symptoms worsen or fail to improve.   Orders:  Orders Placed This Encounter  Procedures  . XR Knee 1-2 Views Left   No orders of the defined types were placed in this encounter.     Procedures: Large Joint Inj: L knee on 12/07/2018 10:10 AM Details: 22 G needle Medications: 2 mL bupivacaine 0.5 %; 2 mL lidocaine 1 %; 40 mg methylPREDNISolone acetate 40 MG/ML Outcome: tolerated well, no immediate complications Patient was prepped and draped in the usual sterile fashion.       Clinical Data: No additional findings.   Subjective: Chief Complaint  Patient presents with  . Left Knee - Pain    Np    Nathan Jones comes in today for left knee pain of insidious onset since just before Thanksgiving.  He denies any injuries.  He feels a aching feeling that is worse with activity.  It feels deep down inside.  He does not endorse any true mechanical symptoms.  Denies any swelling.  Denies any constitutional symptoms or history of gout.   Review of Systems  Constitutional: Negative.   All other systems reviewed and are negative.    Objective: Vital Signs: Ht 6\' 3"  (1.905 m)   Wt 270 lb (122.5 kg)   BMI 33.75 kg/m   Physical Exam Vitals signs and nursing note reviewed.    Constitutional:      Appearance: He is well-developed.  HENT:     Head: Normocephalic and atraumatic.  Eyes:     Pupils: Pupils are equal, round, and reactive to light.  Neck:     Musculoskeletal: Neck supple.  Pulmonary:     Effort: Pulmonary effort is normal.  Abdominal:     Palpations: Abdomen is soft.  Musculoskeletal: Normal range of motion.  Skin:    General: Skin is warm.  Neurological:     Mental Status: He is alert and oriented to person, place, and time.  Psychiatric:        Behavior: Behavior normal.        Thought Content: Thought content normal.        Judgment: Judgment normal.     Ortho Exam Left knee exam shows a trace effusion.  No medial or lateral joint line tenderness.  Collaterals and cruciates are stable.  Normal range of motion. Specialty Comments:  No specialty comments available.  Imaging: Xr Knee 1-2 Views Left  Result Date: 12/07/2018 Mild joint space narrowing and osteoarthritis.  Mild chondrocalcinosis.    PMFS History: Patient Active Problem List   Diagnosis Date Noted  . CAD in native artery  05/19/2016  . Hyperlipidemia 05/19/2016  . Smoker 05/19/2016  . Abdominal aortic aneurysm (AAA) without rupture ([place]) 05/19/2016  . AAA (abdominal aortic aneurysm) ([place]) 11/11/2015  . Aortoiliac occlusive disease ([place]) 11/11/2015  . CAD (coronary artery disease), native coronary artery 09/08/2015  . S/P coronary artery stent placement 09/08/2015  . Essential hypertension 09/08/2015  . Dyslipidemia 09/08/2015  . ED (erectile dysfunction) 09/08/2015  . Dizziness 09/08/2015  . Tobacco abuse 09/08/2015   Past Medical History:  Diagnosis Date  . CAD (coronary artery disease)   . Hyperlipidemia   . Hypertension   . Skin cancer     Family History  Problem Relation Age of Onset  . Heart disease Father        pacemaker  . Heart disease Maternal Grandfather   . Stroke Maternal Grandmother     Past Surgical History:  Procedure Laterality  Date  . APPENDECTOMY  1988  . CARDIAC CATHETERIZATION  06/01/2015   Xience DES to LAD (3.68mmx15mm) - [facility]  . CERVICAL FUSION  2010  . CHOLECYSTECTOMY  1988   Social History   Occupational History  . Occupation: Primary school teacher    Comment: retired  Tobacco Use  . Smoking status: Current Every Day Smoker    Packs/day: 1.00    Years: 45.00    Pack years: 45.00    Types: Cigarettes  . Smokeless tobacco: Never Used  Substance and Sexual Activity  . Alcohol use: Yes    Alcohol/week: 0.0 standard drinks    Comment: social   . Drug use: No  . Sexual activity: Not on file

## 2018-12-10 ENCOUNTER — Telehealth: Payer: Self-pay | Admitting: *Deleted

## 2018-12-10 MED ORDER — EZETIMIBE 10 MG PO TABS: 10.0000 mg | ORAL_TABLET | Freq: Every day | ORAL | 0 refills | Status: DC

## 2018-12-10 NOTE — Telephone Encounter (Signed)
REFILL 

## 2018-12-13 ENCOUNTER — Other Ambulatory Visit (INDEPENDENT_AMBULATORY_CARE_PROVIDER_SITE_OTHER): Payer: Self-pay

## 2018-12-13 ENCOUNTER — Telehealth (INDEPENDENT_AMBULATORY_CARE_PROVIDER_SITE_OTHER): Payer: Self-pay | Admitting: Orthopaedic Surgery

## 2018-12-13 DIAGNOSIS — M25562 Pain in left knee: Secondary | ICD-10-CM

## 2018-12-13 NOTE — Telephone Encounter (Signed)
Mri order made someone will contact him to schedule.

## 2018-12-13 NOTE — Telephone Encounter (Signed)
Patient would like a Rf on Meloxicam 15mg . States Dr Laurann Montana (PCP)  Filled it for him previously and wanted to see if you can fill it for him since he's under your care? Pending MRI.

## 2018-12-13 NOTE — Telephone Encounter (Signed)
Yes r/o structural abnl

## 2018-12-13 NOTE — Telephone Encounter (Signed)
Patient called stating that the cortisone injection did not work and would like to proceed with an MRI.  CB#[phone].  Thank you.

## 2018-12-13 NOTE — Telephone Encounter (Signed)
See message below. Okay to put in MRI order?

## 2018-12-13 NOTE — Telephone Encounter (Signed)
Yes 15 mg daily prn #30

## 2018-12-14 ENCOUNTER — Other Ambulatory Visit (INDEPENDENT_AMBULATORY_CARE_PROVIDER_SITE_OTHER): Payer: Self-pay

## 2018-12-14 ENCOUNTER — Telehealth (INDEPENDENT_AMBULATORY_CARE_PROVIDER_SITE_OTHER): Payer: Self-pay | Admitting: Orthopedic Surgery

## 2018-12-14 MED ORDER — MELOXICAM 15 MG PO TABS: 15.0000 mg | ORAL_TABLET | Freq: Every day | ORAL | 0 refills | Status: DC

## 2018-12-14 NOTE — Telephone Encounter (Signed)
Rx faxed into pharm

## 2018-12-14 NOTE — Telephone Encounter (Signed)
RX PRINTED PATIENT WILL PICK UP

## 2018-12-14 NOTE — Telephone Encounter (Signed)
Mr. Guidotti would now like Rx called into [facility] at [facility].

## 2018-12-18 DIAGNOSIS — L821 Other seborrheic keratosis: Secondary | ICD-10-CM | POA: Diagnosis not present

## 2018-12-18 DIAGNOSIS — C44622 Squamous cell carcinoma of skin of right upper limb, including shoulder: Secondary | ICD-10-CM | POA: Diagnosis not present

## 2018-12-18 DIAGNOSIS — Z85828 Personal history of other malignant neoplasm of skin: Secondary | ICD-10-CM | POA: Diagnosis not present

## 2018-12-21 DIAGNOSIS — N529 Male erectile dysfunction, unspecified: Secondary | ICD-10-CM | POA: Diagnosis not present

## 2018-12-21 DIAGNOSIS — I10 Essential (primary) hypertension: Secondary | ICD-10-CM | POA: Diagnosis not present

## 2018-12-21 DIAGNOSIS — G4733 Obstructive sleep apnea (adult) (pediatric): Secondary | ICD-10-CM | POA: Diagnosis not present

## 2018-12-21 DIAGNOSIS — I2510 Atherosclerotic heart disease of native coronary artery without angina pectoris: Secondary | ICD-10-CM | POA: Diagnosis not present

## 2018-12-21 DIAGNOSIS — F329 Major depressive disorder, single episode, unspecified: Secondary | ICD-10-CM | POA: Diagnosis not present

## 2018-12-23 ENCOUNTER — Ambulatory Visit
Admission: RE | Admit: 2018-12-23 | Discharge: 2018-12-23 | Disposition: A | Discharge status: Home / Self Care | Payer: Medicare Other | Source: Ambulatory Visit | Attending: Orthopaedic Surgery | Admitting: Orthopaedic Surgery

## 2018-12-23 ENCOUNTER — Other Ambulatory Visit: Payer: Self-pay

## 2018-12-23 DIAGNOSIS — M25562 Pain in left knee: Secondary | ICD-10-CM

## 2018-12-23 DIAGNOSIS — M23322 Other meniscus derangements, posterior horn of medial meniscus, left knee: Secondary | ICD-10-CM | POA: Diagnosis not present

## 2018-12-28 ENCOUNTER — Ambulatory Visit (INDEPENDENT_AMBULATORY_CARE_PROVIDER_SITE_OTHER): Payer: Medicare Other | Admitting: Orthopaedic Surgery

## 2018-12-28 DIAGNOSIS — S83242A Other tear of medial meniscus, current injury, left knee, initial encounter: Secondary | ICD-10-CM

## 2018-12-28 MED ORDER — TRAMADOL HCL 50 MG PO TABS: 50.0000 mg | ORAL_TABLET | Freq: Three times a day (TID) | ORAL | 2 refills | Status: DC | PRN

## 2018-12-28 NOTE — Progress Notes (Signed)
Office Visit Note   Patient: Nathan Jones           Date of Birth: February 17, 1951           MRN: [id number] Visit Date: 12/28/2018              Requested by: Nathan Orn, MD [address] [place] [address] [place], [place] [address] PCP: Nathan Orn, MD   Assessment & Plan: Visit Diagnoses:  1. Acute medial meniscus tear, left, initial encounter     Plan: MRI shows a complex tear the posterior horn the medial meniscus with extrusion.  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  However his symptoms are more consistent with a symptomatic meniscus tear.  Therefore at this point we will proceed with arthroscopic partial medial meniscectomy and chondroplasty as indicated.  In the meantime we will give him a hinged knee brace for support and prescription for tramadol.  We will schedule his surgery in the near future.  He will need to stop his baby aspirin 1 week in advance.  Follow-Up Instructions: Return for 1 week postop visit.   Orders:  No orders of the defined types were placed in this encounter.  Meds ordered this encounter  Medications  . traMADol (ULTRAM) 50 MG tablet    Sig: Take 1-2 tablets (50-100 mg total) by mouth 3 (three) times daily as needed.    Dispense:  30 tablet    Refill:  2      Procedures: No procedures performed   Clinical Data: No additional findings.   Subjective: Chief Complaint  Patient presents with  . Left Knee - Follow-up    Nathan Jones returns today for MRI review of his left knee.  He continues to have sharp stabbing pain on the medial side of his knee.  Previous cortisone injection helped very minimally.   Review of Systems  Constitutional: Negative.   All other systems reviewed and are negative.    Objective: Vital Signs: There were no vitals taken for this visit.  Physical Exam Vitals signs and nursing note reviewed.  Constitutional:      Appearance: He is well-developed.  Pulmonary:     Effort: Pulmonary effort is normal.    Abdominal:     Palpations: Abdomen is soft.  Skin:    General: Skin is warm.  Neurological:     Mental Status: He is alert and oriented to person, place, and time.  Psychiatric:        Behavior: Behavior normal.        Thought Content: Thought content normal.        Judgment: Judgment normal.     Ortho Exam Left knee exam shows a trace joint effusion.  Exquisite medial joint line tenderness. Specialty Comments:  No specialty comments available.  Imaging: No results found.   PMFS History: Patient Active Problem List   Diagnosis Date Noted  . Acute medial meniscus tear, left, initial encounter 12/28/2018  . CAD in native artery 05/19/2016  . Hyperlipidemia 05/19/2016  . Smoker 05/19/2016  . Abdominal aortic aneurysm (AAA) without rupture ([place]) 05/19/2016  . AAA (abdominal aortic aneurysm) ([place]) 11/11/2015  . Aortoiliac occlusive disease ([place]) 11/11/2015  . CAD (coronary artery disease), native coronary artery 09/08/2015  . S/P coronary artery stent placement 09/08/2015  . Essential hypertension 09/08/2015  . Dyslipidemia 09/08/2015  . ED (erectile dysfunction) 09/08/2015  . Dizziness 09/08/2015  . Tobacco abuse 09/08/2015   Past Medical History:  Diagnosis Date  . CAD (  coronary artery disease)   . Hyperlipidemia   . Hypertension   . Skin cancer     Family History  Problem Relation Age of Onset  . Heart disease Father        pacemaker  . Heart disease Maternal Grandfather   . Stroke Maternal Grandmother     Past Surgical History:  Procedure Laterality Date  . APPENDECTOMY  1988  . CARDIAC CATHETERIZATION  06/01/2015   Xience DES to LAD (3.37mmx15mm) - [facility]  . CERVICAL FUSION  2010  . CHOLECYSTECTOMY  1988   Social History   Occupational History  . Occupation: Primary school teacher    Comment: retired  Tobacco Use  . Smoking status: Current Every Day Smoker    Packs/day: 1.00    Years: 45.00    Pack years: 45.00    Types: Cigarettes   . Smokeless tobacco: Never Used  Substance and Sexual Activity  . Alcohol use: Yes    Alcohol/week: 0.0 standard drinks    Comment: social   . Drug use: No  . Sexual activity: Not on file

## 2019-01-01 ENCOUNTER — Telehealth (INDEPENDENT_AMBULATORY_CARE_PROVIDER_SITE_OTHER): Payer: Self-pay | Admitting: Orthopaedic Surgery

## 2019-01-01 ENCOUNTER — Encounter (INDEPENDENT_AMBULATORY_CARE_PROVIDER_SITE_OTHER): Payer: Self-pay | Admitting: Orthopaedic Surgery

## 2019-01-01 NOTE — Telephone Encounter (Signed)
Patient called stating that he is going out of town and does not have enough Tramadol to last for the duration of the trip.  Wants to know if [facility] can write another RX that will last him during this time.  Please call patient to advise [phone]

## 2019-01-02 ENCOUNTER — Telehealth (INDEPENDENT_AMBULATORY_CARE_PROVIDER_SITE_OTHER): Payer: Self-pay | Admitting: Orthopaedic Surgery

## 2019-01-02 NOTE — Telephone Encounter (Signed)
Yes #30

## 2019-01-02 NOTE — Telephone Encounter (Signed)
See message.  Email sent to you too. Just respond to one.

## 2019-01-02 NOTE — Telephone Encounter (Signed)
yes

## 2019-01-02 NOTE — Telephone Encounter (Signed)
Patient left a message stating that he is experiencing some bruising around his left ankle and is concerned about it because he does not remember hurting it in any way.  CB#[phone].  Thank you.

## 2019-01-03 ENCOUNTER — Ambulatory Visit (INDEPENDENT_AMBULATORY_CARE_PROVIDER_SITE_OTHER): Payer: Medicare Other | Admitting: Orthopaedic Surgery

## 2019-01-03 ENCOUNTER — Ambulatory Visit (HOSPITAL_COMMUNITY)
Admission: RE | Admit: 2019-01-03 | Discharge: 2019-01-03 | Disposition: A | Discharge status: Home / Self Care | Payer: Medicare Other | Source: Ambulatory Visit | Attending: Orthopaedic Surgery | Admitting: Orthopaedic Surgery

## 2019-01-03 ENCOUNTER — Ambulatory Visit (INDEPENDENT_AMBULATORY_CARE_PROVIDER_SITE_OTHER): Payer: Medicare Other

## 2019-01-03 ENCOUNTER — Telehealth (INDEPENDENT_AMBULATORY_CARE_PROVIDER_SITE_OTHER): Payer: Self-pay

## 2019-01-03 DIAGNOSIS — M25572 Pain in left ankle and joints of left foot: Secondary | ICD-10-CM | POA: Diagnosis not present

## 2019-01-03 MED ORDER — TRAMADOL HCL 50 MG PO TABS: 50.0000 mg | ORAL_TABLET | Freq: Three times a day (TID) | ORAL | 0 refills | Status: DC | PRN

## 2019-01-03 NOTE — Progress Notes (Signed)
Please let him know it's negative

## 2019-01-03 NOTE — Telephone Encounter (Signed)
They called from [facility] stating that doppler was negative for DVT.

## 2019-01-03 NOTE — Telephone Encounter (Signed)
Gave him printed script. Per Dr. Erlinda Hong okay to fill Rx early

## 2019-01-03 NOTE — Addendum Note (Signed)
Addended by: Precious Bard on: 01/03/2019 09:00 AM   Modules accepted: Orders

## 2019-01-03 NOTE — Progress Notes (Signed)
Left lower extremity venous duplex has been completed. Negative for DVT. Results were given to Kathlee Nations at Dr. Phoebe Sharps office.  01/03/19 2:07 PM Carlos Levering RVT

## 2019-01-03 NOTE — Progress Notes (Signed)
Office Visit Note   Patient: Nathan Jones           Date of Birth: [birth year]           MRN: [id number] Visit Date: 01/03/2019              Requested by: Lavone Orn, MD [address] [place] [place],  [address] PCP: Lavone Orn, MD   Assessment & Plan: Visit Diagnoses:  1. Pain in left ankle and joints of left foot     Plan: Impression is insidious onset of left ankle swelling and bruising.  We will obtain Doppler to rule out DVT.  I recommend compression hose for the swelling which he already has at home.  I suspect that he may have had a partial tear of his calf from his recent altered gait due to his medial meniscal tear which has led to the dependent edema and bruising.  We will call the patient with the results of the Doppler.  Follow-Up Instructions: Return if symptoms worsen or fail to improve.   Orders:  Orders Placed This Encounter  Procedures  . XR Ankle Complete Left  . XR Tibia/Fibula Left   No orders of the defined types were placed in this encounter.     Procedures: No procedures performed   Clinical Data: No additional findings.   Subjective: Chief Complaint  Patient presents with  . Left Ankle - Pain    Nathan Jones is a 68 year old gentleman who comes in today for acute bruising of the medial side of his left ankle without any known injuries.  He said that this was noticed by his family member.  He is scheduled for knee arthroscopy later this month.  He states that he really does not have any pain just mainly swelling or bruising.  It sounds like he may have had a Haglund's deformity excision as well as Achilles debridement about a year ago with Dr. Berenice Primas.  He states that he has no weakness or pain that is reminiscent of that.  He denies any chest pain or shortness of breath.  Denies any history of DVT.   Review of Systems  Constitutional: Negative.   All other systems reviewed and are negative.    Objective: Vital Signs: There were no  vitals taken for this visit.  Physical Exam Vitals signs and nursing note reviewed.  Constitutional:      Appearance: He is well-developed.  Pulmonary:     Effort: Pulmonary effort is normal.  Abdominal:     Palpations: Abdomen is soft.  Skin:    General: Skin is warm.  Neurological:     Mental Status: He is alert and oriented to person, place, and time.  Psychiatric:        Behavior: Behavior normal.        Thought Content: Thought content normal.        Judgment: Judgment normal.     Ortho Exam Left lower extremity exam shows 2+ pitting edema around the ankle region.  There is some mild to moderate ecchymosis on the medial aspect of the ankle and heel.  The Achilles is intact to palpation.  He has excellent plantarflexion strength.  His calf is not swollen.  He has some mild discomfort with palpation along the posterior medial cortex of the midshaft of the tibia.  Overall his motor and sensation are intact. Specialty Comments:  No specialty comments available.  Imaging: No results found.   PMFS History: Patient Active Problem List  Diagnosis Date Noted  . Acute medial meniscus tear, left, initial encounter 12/28/2018  . CAD in native artery 05/19/2016  . Hyperlipidemia 05/19/2016  . Smoker 05/19/2016  . Abdominal aortic aneurysm (AAA) without rupture ([place]) 05/19/2016  . AAA (abdominal aortic aneurysm) ([place]) 11/11/2015  . Aortoiliac occlusive disease ([place]) 11/11/2015  . CAD (coronary artery disease), native coronary artery 09/08/2015  . S/P coronary artery stent placement 09/08/2015  . Essential hypertension 09/08/2015  . Dyslipidemia 09/08/2015  . ED (erectile dysfunction) 09/08/2015  . Dizziness 09/08/2015  . Tobacco abuse 09/08/2015   Past Medical History:  Diagnosis Date  . CAD (coronary artery disease)   . Hyperlipidemia   . Hypertension   . Skin cancer     Family History  Problem Relation Age of Onset  . Heart disease Father        pacemaker  . Heart  disease Maternal Grandfather   . Stroke Maternal Grandmother     Past Surgical History:  Procedure Laterality Date  . APPENDECTOMY  1988  . CARDIAC CATHETERIZATION  06/01/2015   Xience DES to LAD (3.89mmx15mm) - [facility]  . CERVICAL FUSION  2010  . CHOLECYSTECTOMY  1988   Social History   Occupational History  . Occupation: Primary school teacher    Comment: retired  Tobacco Use  . Smoking status: Current Every Day Smoker    Packs/day: 1.00    Years: 45.00    Pack years: 45.00    Types: Cigarettes  . Smokeless tobacco: Never Used  Substance and Sexual Activity  . Alcohol use: Yes    Alcohol/week: 0.0 standard drinks    Comment: social   . Drug use: No  . Sexual activity: Not on file

## 2019-01-03 NOTE — Telephone Encounter (Signed)
Patient was seen in our office today.  

## 2019-01-08 NOTE — Progress Notes (Signed)
Chart reviewed with Dr Royce Macadamia, last saw cardiology 10-26-17 and was supposed to follow-up in 56mos and has not. Pt has hx DES to LAD 2016, and is on ASA. Sherri at Dr Phoebe Sharps office notified.

## 2019-01-11 ENCOUNTER — Telehealth: Payer: Self-pay | Admitting: Internal Medicine

## 2019-01-11 NOTE — Telephone Encounter (Signed)
   Primary Cardiologist: Dr Debara Pickett  Chart reviewed as part of pre-operative protocol coverage. Because of Nathan Jones's past medical history and time since last visit, he/she will require a follow-up visit in order to better assess preoperative cardiovascular risk.  Pre-op covering staff: - Please schedule appointment with Dr Debara Pickett or Dr Lysbeth Penner APP at [facility] and call patient to inform them. - Please contact requesting surgeon's office via preferred method (i.e, phone, fax) to inform them of need for appointment prior to surgery.  If applicable, this message will also be routed to pharmacy pool and/or primary cardiologist for input on holding anticoagulant/antiplatelet agent as requested below so that this information is available at time of patient's appointment.   Kerin Ransom, PA-C  01/11/2019, 4:24 PM

## 2019-01-11 NOTE — Telephone Encounter (Signed)
Left message for patient on his cell phone and wife cell phone advising patient to contact office.

## 2019-01-11 NOTE — Telephone Encounter (Signed)
New message         [facility] Pre-operative Risk Assessment    Request for surgical clearance:  1. What type of surgery is being performed? Left knee  Arthroscopy   2. When is this surgery scheduled? 01/16/19  3. What type of clearance is required (medical clearance vs. Pharmacy clearance to hold med vs. Both)? Unknown thinks medical   4. Are there any medications that need to be held prior to surgery and how long?asprin a week before    5. Practice name and name of physician performing surgery? [facility]  6. What is your office phone number  [phone]   7.   What is your office fax [phone]  8.   Anesthesia type (None, local, MAC, general) ? General    Einar Grad Penn 01/11/2019, 2:17 PM  _________________________________________________________________   (provider comments below)

## 2019-01-12 ENCOUNTER — Other Ambulatory Visit: Payer: Self-pay | Admitting: Internal Medicine

## 2019-01-14 ENCOUNTER — Other Ambulatory Visit: Payer: Self-pay

## 2019-01-14 ENCOUNTER — Telehealth (INDEPENDENT_AMBULATORY_CARE_PROVIDER_SITE_OTHER): Payer: Self-pay

## 2019-01-14 ENCOUNTER — Encounter (HOSPITAL_BASED_OUTPATIENT_CLINIC_OR_DEPARTMENT_OTHER): Payer: Self-pay | Admitting: *Deleted

## 2019-01-14 NOTE — Progress Notes (Signed)
Cardiology Office Note   Date:  01/15/2019   ID:  Nathan Jones, DOB [birth year], MRN [id number]  PCP:  Lavone Orn, MD  Cardiologist: Dr. Debara Pickett Chief Complaint  Patient presents with  . Pre-op Exam  . Coronary Artery Disease  . Hypertension     History of Present Illness: Nathan Jones is a 68 y.o. male who presents for pre-surgical cardiac clearance for left knee arthroscopy with [facility], Dr. Erlinda Hong.   He has a history of CAD, HTN, AAA, and HLD. He as DES to the proximal LAD, 2016.. This was completed in [facility], in [place]. He is that he was on Effient monotherapy, Zocor with history of intolerance to Crestor and Lipitor. 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   On last office visit he was seen by Almyra Deforest for [facility] evaluation for Achilles tendon debridement. He was scheduled for a [employer]. This was found to be a low risk study on 11/07/2017, and he was cleared for surgery. Follow up abdominal ultrasound or AAA, dated 01/03/2019 was essentially unchanged from prior study 12/2016.   He denies chest pain, DOE, or palpitations. He is just back from a cruise and is planning a trip to [place] soon.   Past Medical History:  Diagnosis Date  . CAD (coronary artery disease)    stent to LAD 2016  . Hyperlipidemia   . Hypertension   . MMT (medial meniscus tear)    left knee  . Skin cancer   . Sleep apnea    uses CPAP nightly  . Smoker    1ppd    Past Surgical History:  Procedure Laterality Date  . APPENDECTOMY  1988  . CARDIAC CATHETERIZATION  06/01/2015   Xience DES to LAD (3.76mmx15mm) - [facility]  . CERVICAL FUSION  2010  . CHOLECYSTECTOMY  1988     Current Outpatient Medications  Medication Sig Dispense Refill  . aspirin EC 81 MG tablet Take 81 mg by mouth daily.    . Avanafil (STENDRA PO) Take by mouth daily  as needed.    . ezetimibe (ZETIA) 10 MG tablet TAKE ONE TABLET BY MOUTH DAILY 30 tablet 11  . ibuprofen (ADVIL) 200 MG tablet Take 200 mg by mouth every 6 (six) hours as needed.    Marland Kitchen lisinopril (PRINIVIL,ZESTRIL) 20 MG tablet Take 1 tablet (20 mg total) by mouth daily. 30 tablet 11  . meloxicam (MOBIC) 15 MG tablet Take 1 tablet (15 mg total) by mouth daily. 30 tablet 0  . metoprolol tartrate (LOPRESSOR) 25 MG tablet Take 0.5 tablets (12.5 mg total) by mouth at bedtime. 15 tablet 11  . pravastatin (PRAVACHOL) 40 MG tablet Take 1 tablet (40 mg total) by mouth every evening. 30 tablet 11  . traMADol (ULTRAM) 50 MG tablet Take 1-2 tablets (50-100 mg total) by mouth 3 (three) times daily as needed. 30 tablet 0  . ValACYclovir HCl (VALTREX PO) Take 1,000 mg by mouth as needed.     No current facility-administered medications for this visit.     Allergies:   Atorvastatin    Social History:  The patient  reports that he has been smoking cigarettes. He has a 45.00 pack-year smoking history. He has never used smokeless tobacco. He reports current alcohol use. He reports that he does not use drugs.   Family History:  The patient's family  history includes Heart disease in his father and maternal grandfather; Stroke in his maternal grandmother.    ROS: All other systems are reviewed and negative. Unless otherwise mentioned in H&P    PHYSICAL EXAM: VS:  BP 132/70   Pulse (!) 58   Ht 6\' 3"  (1.905 m)   Wt 273 lb 9.6 oz (124.1 kg)   BMI 34.20 kg/m  , BMI Body mass index is 34.2 kg/m. GEN: Well nourished, well developed, in no acute distress HEENT: normal Neck: no JVD, carotid bruits, or masses Cardiac: RRR; no murmurs, rubs, or gallops,no edema  Respiratory:  Clear to auscultation bilaterally, normal work of breathing GI: soft, nontender, nondistended, + BS MS: no deformity or atrophy Skin: warm and dry, no rash Neuro:  Strength and sensation are intact Psych: euthymic mood, full  affect   EKG:  Sinus bradycardia with PAC's rate of 58 bpm.   Recent Labs: No results found for requested labs within last 8760 hours.    Lipid Panel    Component Value Date/Time   CHOL 150 05/19/2016 0857   CHOL 156 09/16/2015 0803   TRIG 147 05/19/2016 0857   TRIG 164 (H) 09/16/2015 0803   HDL 31 (L) 05/19/2016 0857   HDL 30 (L) 09/16/2015 0803   CHOLHDL 4.8 05/19/2016 0857   VLDL 29 05/19/2016 0857   LDLCALC 90 05/19/2016 0857   LDLCALC 93 09/16/2015 0803      Wt Readings from Last 3 Encounters:  01/15/19 273 lb 9.6 oz (124.1 kg)  12/07/18 270 lb (122.5 kg)  11/07/17 270 lb (122.5 kg)      Other studies Reviewed: NM Stress Test: 11/07/2017  The left ventricular ejection fraction is mildly decreased (45-54%).  Nuclear stress EF: 54%.  There was no ST segment deviation noted during stress.  The study is normal.  This is a low risk study.   Low risk stress nuclear study with normal perfusion and normal left ventricular regional and global systolic function.   ASSESSMENT AND PLAN:  1.  Pre-Operative Cardiac Evaluation:  Chart reviewed as part of pre-operative protocol coverage. Given past medical history and time since last visit, based on ACC/AHA guidelines, Nathan Jones would be at acceptable risk for the planned procedure without further cardiovascular testing.   2.CAD: He is stable from CV standpoint. No changes in his medications. He will continue ACE and BB.   3. Hypertension: BP is well controlled. No changes in his medications.   4. Hypercholesterolemia: He is to continue Zetia and Pravastatin with goal of LDL < 70.     Current medicines are reviewed at length with the patient today.    Labs/ tests ordered today include: None Nathan Jones. [place] Pugh, ANP, AACC   01/15/2019 1:06 PM    [facility] [place] [address] Office [phone] Fax [phone]

## 2019-01-14 NOTE — Telephone Encounter (Signed)
Please review for refill, last ov 10/2017.

## 2019-01-14 NOTE — Telephone Encounter (Signed)
Patient left voice mail requesting a call back regarding post op recovery and restrictions. [phone]

## 2019-01-14 NOTE — Telephone Encounter (Signed)
4-6 weeks.  No real restrictions.  Just activity as tolerated

## 2019-01-14 NOTE — Telephone Encounter (Signed)
See message.

## 2019-01-15 ENCOUNTER — Encounter: Payer: Self-pay | Admitting: Adult Health

## 2019-01-15 ENCOUNTER — Ambulatory Visit (HOSPITAL_COMMUNITY)
Admission: RE | Admit: 2019-01-15 | Discharge: 2019-01-15 | Disposition: A | Discharge status: Home / Self Care | Payer: Medicare Other | Source: Ambulatory Visit | Attending: Internal Medicine | Admitting: Internal Medicine

## 2019-01-15 ENCOUNTER — Ambulatory Visit (INDEPENDENT_AMBULATORY_CARE_PROVIDER_SITE_OTHER): Payer: Medicare Other | Admitting: Adult Health

## 2019-01-15 VITALS — BP 132/70 | HR 58 | Ht 75.0 in | Wt 273.6 lb

## 2019-01-15 DIAGNOSIS — I10 Essential (primary) hypertension: Secondary | ICD-10-CM | POA: Diagnosis not present

## 2019-01-15 DIAGNOSIS — E7800 Pure hypercholesterolemia, unspecified: Secondary | ICD-10-CM

## 2019-01-15 DIAGNOSIS — I2510 Atherosclerotic heart disease of native coronary artery without angina pectoris: Secondary | ICD-10-CM

## 2019-01-15 DIAGNOSIS — I714 Abdominal aortic aneurysm, without rupture, unspecified: Secondary | ICD-10-CM

## 2019-01-15 DIAGNOSIS — Z01810 Encounter for preprocedural cardiovascular examination: Secondary | ICD-10-CM

## 2019-01-15 MED ORDER — EZETIMIBE 10 MG PO TABS: ORAL_TABLET | ORAL | 11 refills | Status: DC

## 2019-01-15 MED ORDER — METOPROLOL TARTRATE 25 MG PO TABS: 12.5000 mg | ORAL_TABLET | Freq: Every day | ORAL | 11 refills | Status: DC

## 2019-01-15 MED ORDER — LISINOPRIL 20 MG PO TABS: 20.0000 mg | ORAL_TABLET | Freq: Every day | ORAL | 11 refills | Status: AC

## 2019-01-15 MED ORDER — PRAVASTATIN SODIUM 40 MG PO TABS: 40.0000 mg | ORAL_TABLET | Freq: Every evening | ORAL | 11 refills | Status: DC

## 2019-01-15 NOTE — Patient Instructions (Addendum)
CLEARED FOR LEFT KNEE ARTHROSCOPY WITH PARTIAL MEDIAL MENISCECTOMY- [facility]  Follow-Up: You will need a follow up appointment in 3 months.  Please call our office 2 months in advance to schedule this appointment.  You may see Pixie Casino, MD -ONLY or one of the following Advanced Practice Providers on your designated Care Team:  Almyra Deforest, PA-C  Fabian Sharp, [place]   Medication Instructions:  NO CHANGES- Your physician recommends that you continue on your current medications as directed. Please refer to the Current Medication list given to you today. If you need a refill on your cardiac medications before your next appointment, please call your pharmacy. Labwork: When you have labs (blood work) and your tests are completely normal, you will receive your results ONLY by [place] (if you have MyChart) -OR- A paper copy in the mail.  At [facility], you and your health needs are our priority.  As part of our continuing mission to provide you with exceptional heart care, we have created designated Provider Care Teams.  These Care Teams include your primary Cardiologist (physician) and Advanced Practice Providers (APPs -  Physician Assistants and Nurse Practitioners) who all work together to provide you with the care you need, when you need it.  Thank you for choosing [facility] at [facility]!!

## 2019-01-15 NOTE — Telephone Encounter (Signed)
Pt is scheduled to see Jory Sims, NP, today, 01/15/2019 @ 11:00.

## 2019-01-16 ENCOUNTER — Ambulatory Visit (HOSPITAL_BASED_OUTPATIENT_CLINIC_OR_DEPARTMENT_OTHER): Payer: Medicare Other | Admitting: Anesthesiology

## 2019-01-16 ENCOUNTER — Encounter: Payer: Self-pay | Admitting: Orthopaedic Surgery

## 2019-01-16 ENCOUNTER — Ambulatory Visit (HOSPITAL_BASED_OUTPATIENT_CLINIC_OR_DEPARTMENT_OTHER)
Admission: RE | Admit: 2019-01-16 | Discharge: 2019-01-16 | Disposition: A | Discharge status: Home / Self Care | Payer: Medicare Other | Attending: Orthopaedic Surgery | Admitting: Orthopaedic Surgery

## 2019-01-16 ENCOUNTER — Encounter (HOSPITAL_BASED_OUTPATIENT_CLINIC_OR_DEPARTMENT_OTHER): Payer: Self-pay | Admitting: Certified Registered"

## 2019-01-16 ENCOUNTER — Encounter (HOSPITAL_BASED_OUTPATIENT_CLINIC_OR_DEPARTMENT_OTHER)
Admission: RE | Disposition: A | Discharge status: Home / Self Care | Payer: Self-pay | Source: Home / Self Care | Attending: Orthopaedic Surgery

## 2019-01-16 ENCOUNTER — Other Ambulatory Visit: Payer: Self-pay | Admitting: *Deleted

## 2019-01-16 DIAGNOSIS — Z955 Presence of coronary angioplasty implant and graft: Secondary | ICD-10-CM | POA: Insufficient documentation

## 2019-01-16 DIAGNOSIS — Z7982 Long term (current) use of aspirin: Secondary | ICD-10-CM | POA: Insufficient documentation

## 2019-01-16 DIAGNOSIS — M659 Synovitis and tenosynovitis, unspecified: Secondary | ICD-10-CM

## 2019-01-16 DIAGNOSIS — S83232A Complex tear of medial meniscus, current injury, left knee, initial encounter: Secondary | ICD-10-CM | POA: Diagnosis not present

## 2019-01-16 DIAGNOSIS — I714 Abdominal aortic aneurysm, without rupture, unspecified: Secondary | ICD-10-CM

## 2019-01-16 DIAGNOSIS — Z85828 Personal history of other malignant neoplasm of skin: Secondary | ICD-10-CM | POA: Diagnosis not present

## 2019-01-16 DIAGNOSIS — G4730 Sleep apnea, unspecified: Secondary | ICD-10-CM | POA: Diagnosis not present

## 2019-01-16 DIAGNOSIS — Z791 Long term (current) use of non-steroidal anti-inflammatories (NSAID): Secondary | ICD-10-CM | POA: Diagnosis not present

## 2019-01-16 DIAGNOSIS — Z79899 Other long term (current) drug therapy: Secondary | ICD-10-CM | POA: Diagnosis not present

## 2019-01-16 DIAGNOSIS — F17210 Nicotine dependence, cigarettes, uncomplicated: Secondary | ICD-10-CM | POA: Diagnosis not present

## 2019-01-16 DIAGNOSIS — I2510 Atherosclerotic heart disease of native coronary artery without angina pectoris: Secondary | ICD-10-CM | POA: Diagnosis not present

## 2019-01-16 DIAGNOSIS — M2342 Loose body in knee, left knee: Secondary | ICD-10-CM

## 2019-01-16 DIAGNOSIS — X58XXXA Exposure to other specified factors, initial encounter: Secondary | ICD-10-CM | POA: Diagnosis not present

## 2019-01-16 DIAGNOSIS — M94262 Chondromalacia, left knee: Secondary | ICD-10-CM | POA: Diagnosis not present

## 2019-01-16 DIAGNOSIS — I10 Essential (primary) hypertension: Secondary | ICD-10-CM | POA: Diagnosis not present

## 2019-01-16 DIAGNOSIS — M2242 Chondromalacia patellae, left knee: Secondary | ICD-10-CM | POA: Diagnosis not present

## 2019-01-16 DIAGNOSIS — S83242A Other tear of medial meniscus, current injury, left knee, initial encounter: Secondary | ICD-10-CM | POA: Diagnosis not present

## 2019-01-16 DIAGNOSIS — M65862 Other synovitis and tenosynovitis, left lower leg: Secondary | ICD-10-CM

## 2019-01-16 DIAGNOSIS — M65962 Unspecified synovitis and tenosynovitis, left lower leg: Secondary | ICD-10-CM

## 2019-01-16 HISTORY — PX: CHONDROPLASTY: SHX5177

## 2019-01-16 HISTORY — DX: Sleep apnea, unspecified: G47.30

## 2019-01-16 HISTORY — DX: Other tear of medial meniscus, current injury, unspecified knee, initial encounter: S83.249A

## 2019-01-16 HISTORY — PX: KNEE ARTHROSCOPY WITH MEDIAL MENISECTOMY: SHX5651

## 2019-01-16 HISTORY — DX: Nicotine dependence, unspecified, uncomplicated: F17.200

## 2019-01-16 SURGERY — ARTHROSCOPY, KNEE, WITH MEDIAL MENISCECTOMY
Anesthesia: General | Site: Knee | Laterality: Left

## 2019-01-16 MED ORDER — MIDAZOLAM HCL 2 MG/2ML IJ SOLN
INTRAMUSCULAR | Status: AC
  Filled 2019-01-16: qty 2

## 2019-01-16 MED ORDER — EPHEDRINE SULFATE-NACL 50-0.9 MG/10ML-% IV SOSY
PREFILLED_SYRINGE | INTRAVENOUS | Status: DC | PRN
  Administered 2019-01-16: 10 mg via INTRAVENOUS

## 2019-01-16 MED ORDER — ONDANSETRON HCL 4 MG/2ML IJ SOLN
INTRAMUSCULAR | Status: DC | PRN
  Administered 2019-01-16: 4 mg via INTRAVENOUS

## 2019-01-16 MED ORDER — PROPOFOL 10 MG/ML IV BOLUS
INTRAVENOUS | Status: AC
  Filled 2019-01-16: qty 40

## 2019-01-16 MED ORDER — ACETAMINOPHEN 500 MG PO TABS
ORAL_TABLET | ORAL | Status: AC
  Filled 2019-01-16: qty 2

## 2019-01-16 MED ORDER — ACETAMINOPHEN 500 MG PO TABS
1000.0000 mg | ORAL_TABLET | Freq: Once | ORAL | Status: AC
  Administered 2019-01-16: 1000 mg via ORAL

## 2019-01-16 MED ORDER — PROPOFOL 10 MG/ML IV BOLUS
INTRAVENOUS | Status: DC | PRN
  Administered 2019-01-16: 150 mg via INTRAVENOUS

## 2019-01-16 MED ORDER — FENTANYL CITRATE (PF) 100 MCG/2ML IJ SOLN
INTRAMUSCULAR | Status: AC
  Filled 2019-01-16: qty 2

## 2019-01-16 MED ORDER — SCOPOLAMINE 1 MG/3DAYS TD PT72: 1.0000 | MEDICATED_PATCH | Freq: Once | TRANSDERMAL | Status: DC | PRN

## 2019-01-16 MED ORDER — CHLORHEXIDINE GLUCONATE 4 % EX LIQD: 60.0000 mL | Freq: Once | CUTANEOUS | Status: DC

## 2019-01-16 MED ORDER — BUPIVACAINE HCL (PF) 0.25 % IJ SOLN
INTRAMUSCULAR | Status: DC | PRN
  Administered 2019-01-16: 20 mL via INTRA_ARTICULAR

## 2019-01-16 MED ORDER — LACTATED RINGERS IV SOLN: INTRAVENOUS | Status: DC

## 2019-01-16 MED ORDER — CEFAZOLIN SODIUM-DEXTROSE 2-4 GM/100ML-% IV SOLN
2.0000 g | INTRAVENOUS | Status: AC
  Administered 2019-01-16: 3 g via INTRAVENOUS

## 2019-01-16 MED ORDER — EPINEPHRINE 30 MG/30ML IJ SOLN
INTRAMUSCULAR | Status: AC
  Filled 2019-01-16: qty 1

## 2019-01-16 MED ORDER — ONDANSETRON HCL 4 MG/2ML IJ SOLN
INTRAMUSCULAR | Status: AC
  Filled 2019-01-16: qty 2

## 2019-01-16 MED ORDER — MIDAZOLAM HCL 5 MG/5ML IJ SOLN
INTRAMUSCULAR | Status: DC | PRN
  Administered 2019-01-16: 2 mg via INTRAVENOUS

## 2019-01-16 MED ORDER — FENTANYL CITRATE (PF) 100 MCG/2ML IJ SOLN: 50.0000 ug | INTRAMUSCULAR | Status: DC | PRN

## 2019-01-16 MED ORDER — FENTANYL CITRATE (PF) 100 MCG/2ML IJ SOLN
INTRAMUSCULAR | Status: DC | PRN
  Administered 2019-01-16: 50 ug via INTRAVENOUS
  Administered 2019-01-16 (×2): 25 ug via INTRAVENOUS
  Administered 2019-01-16: 50 ug via INTRAVENOUS

## 2019-01-16 MED ORDER — SODIUM CHLORIDE 0.9 % IR SOLN
Status: DC | PRN
  Administered 2019-01-16: 6000 mL

## 2019-01-16 MED ORDER — HYDROCODONE-ACETAMINOPHEN 7.5-325 MG PO TABS: 1.0000 | ORAL_TABLET | Freq: Four times a day (QID) | ORAL | 0 refills | Status: DC | PRN

## 2019-01-16 MED ORDER — BUPIVACAINE HCL (PF) 0.25 % IJ SOLN
INTRAMUSCULAR | Status: AC
  Filled 2019-01-16: qty 60

## 2019-01-16 MED ORDER — MIDAZOLAM HCL 2 MG/2ML IJ SOLN: 1.0000 mg | INTRAMUSCULAR | Status: DC | PRN

## 2019-01-16 MED ORDER — DEXAMETHASONE SODIUM PHOSPHATE 10 MG/ML IJ SOLN
INTRAMUSCULAR | Status: DC | PRN
  Administered 2019-01-16: 10 mg via INTRAVENOUS

## 2019-01-16 MED ORDER — LIDOCAINE 2% (20 MG/ML) 5 ML SYRINGE
INTRAMUSCULAR | Status: DC | PRN
  Administered 2019-01-16: 100 mg via INTRAVENOUS

## 2019-01-16 MED ORDER — LACTATED RINGERS IV SOLN
INTRAVENOUS | Status: DC
  Administered 2019-01-16 (×2): via INTRAVENOUS

## 2019-01-16 MED ORDER — FENTANYL CITRATE (PF) 100 MCG/2ML IJ SOLN: 25.0000 ug | INTRAMUSCULAR | Status: DC | PRN

## 2019-01-16 MED ORDER — DEXAMETHASONE SODIUM PHOSPHATE 10 MG/ML IJ SOLN
INTRAMUSCULAR | Status: AC
  Filled 2019-01-16: qty 1

## 2019-01-16 MED ORDER — PHENYLEPHRINE 40 MCG/ML (10ML) SYRINGE FOR IV PUSH (FOR BLOOD PRESSURE SUPPORT)
PREFILLED_SYRINGE | INTRAVENOUS | Status: DC | PRN
  Administered 2019-01-16: 80 ug via INTRAVENOUS
  Administered 2019-01-16: 40 ug via INTRAVENOUS

## 2019-01-16 MED ORDER — PROMETHAZINE HCL 25 MG PO TABS: 25.0000 mg | ORAL_TABLET | Freq: Four times a day (QID) | ORAL | 1 refills | Status: DC | PRN

## 2019-01-16 MED ORDER — CEFAZOLIN SODIUM-DEXTROSE 2-4 GM/100ML-% IV SOLN
INTRAVENOUS | Status: AC
  Filled 2019-01-16: qty 200

## 2019-01-16 MED ORDER — LIDOCAINE 2% (20 MG/ML) 5 ML SYRINGE
INTRAMUSCULAR | Status: AC
  Filled 2019-01-16: qty 5

## 2019-01-16 MED FILL — PROMETHAZINE 25 MG TABLET: 25 | 8 days supply | Qty: 30 | Fill #0

## 2019-01-16 MED FILL — HYDROCODON-APAP 7.5-325: 7.5-325 | 8 days supply | Qty: 30 | Fill #0

## 2019-01-16 SURGICAL SUPPLY — 46 items
BANDAGE ACE 6X5 VEL STRL LF (GAUZE/BANDAGES/DRESSINGS) ×6 IMPLANT
BANDAGE ESMARK 6X9 LF (GAUZE/BANDAGES/DRESSINGS) IMPLANT
BLADE 4.2CUDA (BLADE) IMPLANT
BLADE CUDA GRT WHITE 3.5 (BLADE) IMPLANT
BLADE CUDA SHAVER 3.5 (BLADE) IMPLANT
BLADE CUTTER GATOR 3.5 (BLADE) IMPLANT
BLADE GREAT WHITE 4.2 (BLADE) IMPLANT
BLADE GREAT WHITE 4.2MM (BLADE)
BNDG ESMARK 6X9 LF (GAUZE/BANDAGES/DRESSINGS) ×3
COVER WAND RF STERILE (DRAPES) IMPLANT
CUFF TOURNIQUET SINGLE 34IN LL (TOURNIQUET CUFF) ×3 IMPLANT
DRAPE ARTHROSCOPY W/POUCH 90 (DRAPES) ×3 IMPLANT
DRAPE IMP U-DRAPE 54X76 (DRAPES) ×3 IMPLANT
DRAPE U-SHAPE 47X51 STRL (DRAPES) ×3 IMPLANT
DURAPREP 26ML APPLICATOR (WOUND CARE) ×3 IMPLANT
ELECT MENISCUS 165MM 90D (ELECTRODE) IMPLANT
ELECT REM PT RETURN 9FT ADLT (ELECTROSURGICAL)
ELECTRODE REM PT RTRN 9FT ADLT (ELECTROSURGICAL) IMPLANT
GAUZE SPONGE 4X4 12PLY STRL (GAUZE/BANDAGES/DRESSINGS) ×5 IMPLANT
GAUZE XEROFORM 1X8 LF (GAUZE/BANDAGES/DRESSINGS) ×3 IMPLANT
GLOVE BIOGEL PI IND STRL 7.0 (GLOVE) ×1 IMPLANT
GLOVE BIOGEL PI INDICATOR 7.0 (GLOVE) ×6
GLOVE ECLIPSE 6.5 STRL STRAW (GLOVE) ×4 IMPLANT
GLOVE ECLIPSE 7.0 STRL STRAW (GLOVE) ×3 IMPLANT
GLOVE SKINSENSE NS SZ7.5 (GLOVE) ×2
GLOVE SKINSENSE STRL SZ7.5 (GLOVE) ×1 IMPLANT
GLOVE SURG SYN 7.5  E (GLOVE) ×2
GLOVE SURG SYN 7.5 E (GLOVE) ×1 IMPLANT
GLOVE SURG SYN 7.5 PF PI (GLOVE) ×1 IMPLANT
GOWN STRL REIN XL XLG (GOWN DISPOSABLE) ×3 IMPLANT
GOWN STRL REUS W/ TWL LRG LVL3 (GOWN DISPOSABLE) ×1 IMPLANT
GOWN STRL REUS W/ TWL XL LVL3 (GOWN DISPOSABLE) ×1 IMPLANT
GOWN STRL REUS W/TWL LRG LVL3 (GOWN DISPOSABLE) ×2
GOWN STRL REUS W/TWL XL LVL3 (GOWN DISPOSABLE) ×2
IV NS IRRIG 3000ML ARTHROMATIC (IV SOLUTION) ×4 IMPLANT
KNEE WRAP E Z 3 GEL PACK (MISCELLANEOUS) ×3 IMPLANT
MANIFOLD NEPTUNE II (INSTRUMENTS) ×3 IMPLANT
PACK ARTHROSCOPY DSU (CUSTOM PROCEDURE TRAY) ×3 IMPLANT
PACK BASIN DAY SURGERY FS (CUSTOM PROCEDURE TRAY) ×3 IMPLANT
PENCIL BUTTON HOLSTER BLD 10FT (ELECTRODE) IMPLANT
RESECTOR FULL RADIUS 4.2MM (BLADE) IMPLANT
SHAVER 4.2 MM LANZA 9391A (BLADE) ×3 IMPLANT
SUT ETHILON 3 0 PS 1 (SUTURE) ×3 IMPLANT
TOWEL GREEN STERILE FF (TOWEL DISPOSABLE) ×3 IMPLANT
TUBING ARTHRO INFLOW-ONLY STRL (TUBING) ×3 IMPLANT
WATER STERILE IRR 1000ML POUR (IV SOLUTION) ×1 IMPLANT

## 2019-01-16 NOTE — Transfer of Care (Signed)
Immediate Anesthesia Transfer of Care Note  Patient: Nathan Jones  Procedure(s) Performed: LEFT KNEE ARTHROSCOPY WITH PARTIAL MEDIAL MENISCECTOMY (Left Knee) CHONDROPLASTY AND REMOVAL OF LOOSE BODY (Left Knee)  Patient Location: PACU  Anesthesia Type:General  Level of Consciousness: awake, alert  and patient cooperative  Airway & Oxygen Therapy: Patient Spontanous Breathing and Patient connected to face mask oxygen  Post-op Assessment: Report given to RN and Post -op Vital signs reviewed and stable  Post vital signs: Reviewed and stable  Last Vitals:  Vitals Value Taken Time  BP 134/76 01/16/2019  8:46 AM  Temp    Pulse 78 01/16/2019  8:48 AM  Resp 22 01/16/2019  8:48 AM  SpO2 97 % 01/16/2019  8:48 AM  Vitals shown include unvalidated device data.  Last Pain:  Vitals:   01/16/19 0715  TempSrc: Oral  PainSc: 1          Complications: No apparent anesthesia complications

## 2019-01-16 NOTE — Anesthesia Preprocedure Evaluation (Addendum)
Anesthesia Evaluation  Patient identified by MRN, date of birth, ID band Patient awake    Reviewed: Allergy & Precautions, NPO status , Patient's Chart, lab work & pertinent test results, reviewed documented beta blocker date and time   Airway Mallampati: II  TM Distance: >3 FB Neck ROM: Full    Dental no notable dental hx. (+) Teeth Intact, Dental Advisory Given   Pulmonary sleep apnea and Continuous Positive Airway Pressure Ventilation , Current Smoker,    Pulmonary exam normal breath sounds clear to auscultation       Cardiovascular hypertension, Pt. on home beta blockers and Pt. on medications + CAD and + Cardiac Stents (LAD 2016)  Normal cardiovascular exam Rhythm:Regular Rate:Normal  Cardiac Monitor 07/2018 Monitor shows sinus rhythm with sinus bradycardia and PACs.  No significant ventricular arrhythmias, heart block or atrial fibrillation.  Stress Test 2018 The left ventricular ejection fraction is mildly decreased (45-54%). Nuclear stress EF: 54%. There was no ST segment deviation noted during stress. The study is normal. This is a low risk study.  Known AAA, stable, 3.3cm   Neuro/Psych negative neurological ROS  negative psych ROS   GI/Hepatic negative GI ROS, Neg liver ROS,   Endo/Other  negative endocrine ROS  Renal/GU negative Renal ROS  negative genitourinary   Musculoskeletal negative musculoskeletal ROS (+)   Abdominal   Peds  Hematology negative hematology ROS (+)   Anesthesia Other Findings Left medial meniscal tear  Reproductive/Obstetrics                           Anesthesia Physical Anesthesia Plan  ASA: III  Anesthesia Plan: General   Post-op Pain Management:    Induction: Intravenous  PONV Risk Score and Plan: 1 and Ondansetron, Dexamethasone and Midazolam  Airway Management Planned: LMA  Additional Equipment:   Intra-op Plan:   Post-operative  Plan: Extubation in OR  Informed Consent: I have reviewed the patients History and Physical, chart, labs and discussed the procedure including the risks, benefits and alternatives for the proposed anesthesia with the patient or authorized representative who has indicated his/her understanding and acceptance.     Dental advisory given  Plan Discussed with: CRNA  Anesthesia Plan Comments:       Anesthesia Quick Evaluation

## 2019-01-16 NOTE — H&P (Signed)
PREOPERATIVE H&P  Chief Complaint: left knee medial meniscal tear  HPI: Nathan Jones is a 68 y.o. male who presents for surgical treatment of left knee medial meniscal tear.  He denies any changes in medical history.  Past Medical History:  Diagnosis Date  . CAD (coronary artery disease)    stent to LAD 2016  . Hyperlipidemia   . Hypertension   . MMT (medial meniscus tear)    left knee  . Skin cancer   . Sleep apnea    uses CPAP nightly  . Smoker    1ppd   Past Surgical History:  Procedure Laterality Date  . APPENDECTOMY  1988  . CARDIAC CATHETERIZATION  06/01/2015   Xience DES to LAD (3.72mmx15mm) - [facility]  . CERVICAL FUSION  2010  . CHOLECYSTECTOMY  1988   Social History   Socioeconomic History  . Marital status: Married    Spouse name: Not on file  . Number of children: 4  . Years of education: 65  . Highest education level: Not on file  Occupational History  . 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 Financial resource strain: Not on file  . Food insecurity:    Worry: Not on file    Inability: Not on file  . Transportation needs:    Medical: Not on file    Non-medical: Not on file  Tobacco Use  . Smoking status: Current Every Day Smoker    Packs/day: 1.00    Years: 45.00    Pack years: 45.00    Types: Cigarettes  . Smokeless tobacco: Never Used  Substance and Sexual Activity  . Alcohol use: Yes    Alcohol/week: 0.0 standard drinks    Comment: social   . Drug use: No  . Sexual activity: Yes  Lifestyle  . Physical activity:    Days per week: Not on file    Minutes per session: Not on file  . Stress: Not on file  Relationships  . Social connections:    Talks on phone: Not on file    Gets together: Not on file    Attends religious service: Not on file    Active member of club or organization: Not on file    Attends meetings of clubs or organizations: Not on file    Relationship status: Not on file    Other Topics Concern  . Not on file  Social History Narrative   Epworth Sleepiness Scale = 10 (09/09/2015)   Family History  Problem Relation Age of Onset  . Heart disease Father        pacemaker  . Heart disease Maternal Grandfather   . Stroke Maternal Grandmother    Allergies  Allergen Reactions  . Atorvastatin     Gas, stomach pains   Prior to Admission medications   Medication Sig Start Date End Date Taking? Authorizing Provider  aspirin EC 81 MG tablet Take 81 mg by mouth daily.   Yes [provider]  Avanafil (STENDRA PO) Take by mouth daily as needed.   Yes [provider]  ezetimibe (ZETIA) 10 MG tablet TAKE ONE TABLET BY MOUTH DAILY 01/15/19  Yes Lendon Colonel, NP  ibuprofen (ADVIL) 200 MG tablet Take 200 mg by mouth every 6 (six) hours as needed.   Yes [provider]  lisinopril (PRINIVIL,ZESTRIL) 20 MG tablet Take 1 tablet (20 mg total) by mouth daily. 01/15/19  Yes Lendon Colonel, NP  meloxicam (MOBIC) 15 MG tablet Take 1 tablet (15 mg total) by mouth daily. 12/14/18  Yes Leandrew Koyanagi, MD  metoprolol tartrate (LOPRESSOR) 25 MG tablet Take 0.5 tablets (12.5 mg total) by mouth at bedtime. 01/15/19  Yes Lendon Colonel, NP  pravastatin (PRAVACHOL) 40 MG tablet Take 1 tablet (40 mg total) by mouth every evening. 01/15/19  Yes Lendon Colonel, NP  traMADol (ULTRAM) 50 MG tablet Take 1-2 tablets (50-100 mg total) by mouth 3 (three) times daily as needed. 01/03/19  Yes Leandrew Koyanagi, MD  ValACYclovir HCl (VALTREX PO) Take 1,000 mg by mouth as needed.    [provider]     Positive ROS: All other systems have been reviewed and were otherwise negative with the exception of those mentioned in the HPI and as above.  Physical Exam: General: Alert, no acute distress Cardiovascular: No pedal edema Respiratory: No cyanosis, no use of accessory musculature GI: abdomen soft Skin: No lesions in the area of chief  complaint Neurologic: Sensation intact distally Psychiatric: Patient is competent for consent with normal mood and affect Lymphatic: no lymphedema  MUSCULOSKELETAL: exam stable  Assessment: left knee medial meniscal tear  Plan: Plan for Procedure(s): LEFT KNEE ARTHROSCOPY WITH PARTIAL MEDIAL MENISCECTOMY  The risks benefits and alternatives were discussed with the patient including but not limited to the risks of nonoperative treatment, versus surgical intervention including infection, bleeding, nerve injury,  blood clots, cardiopulmonary complications, morbidity, mortality, among others, and they were willing to proceed.   Eduard Roux, MD   01/16/2019 7:31 AM

## 2019-01-16 NOTE — Telephone Encounter (Signed)
IC patient and advised.  

## 2019-01-16 NOTE — Anesthesia Procedure Notes (Signed)
Procedure Name: LMA Insertion Date/Time: 01/16/2019 7:47 AM Performed by: Gwyndolyn Saxon, CRNA Pre-anesthesia Checklist: Patient identified, Emergency Drugs available, Suction available and Patient being monitored Patient Re-evaluated:Patient Re-evaluated prior to induction Oxygen Delivery Method: Circle system utilized Preoxygenation: Pre-oxygenation with 100% oxygen Induction Type: IV induction Ventilation: Mask ventilation without difficulty and Oral airway inserted - appropriate to patient size LMA: LMA inserted LMA Size: 5.0 Number of attempts: 1 Airway Equipment and Method: Patient positioned with wedge pillow Placement Confirmation: positive ETCO2 and breath sounds checked- equal and bilateral Tube secured with: Tape Dental Injury: Teeth and Oropharynx as per pre-operative assessment

## 2019-01-16 NOTE — Op Note (Signed)
Surgery Date: 01/16/2019  Surgeon(s): Leandrew Koyanagi, MD  ASSIST: Madalyn Rob, [place]; necessary for the timely completion of procedure and due to complexity of procedure.  ANESTHESIA:  general  FLUIDS: Per anesthesia record.   ESTIMATED BLOOD LOSS: minimal  PREOPERATIVE DIAGNOSES:  1. Left knee medial meniscus tear 2. Left knee synovitis 3. Left knee loose bodies x 2 4. Left knee chondromalacia  POSTOPERATIVE DIAGNOSES:  same  PROCEDURES PERFORMED:  1. Left knee arthroscopy with major synovectomy 2. Left knee arthroscopy with arthroscopic partial medial meniscectomy 3. Left knee arthroscopy with arthroscopic chondroplasty medial femoral condyle, lateral femoral condyle, femoral trochlea.  4. Left knee arthroscopy with removal of 2 loose bodies  DESCRIPTION OF PROCEDURE: Nathan Jones is a 68 y.o.-year-old male with left knee medial meniscus tear and chondromalacia. Plans are to proceed with partial medial meniscectomy and diagnostic arthroscopy with debridement as indicated. Full discussion held regarding risks benefits alternatives and complications related surgical intervention. Conservative care options reviewed. All questions answered.  The patient was identified in the preoperative holding area and the operative extremity was marked. The patient was brought to the operating room and transferred to operating table in a supine position. Satisfactory general anesthesia was induced by anesthesiology.    Standard anterolateral, anteromedial arthroscopy portals were obtained. The anteromedial portal was obtained with a spinal needle for localization under direct visualization with subsequent diagnostic findings.   Incisions were made for arthroscopy portals.  We first performed a major synovectomy in all 3 compartments of the knee with a oscillating shaver.  I then turned my attention to the medial compartment.  He did have significant grade III-IV chondromalacia of the medial  femoral condyle with areas of unstable cartilage.  Chondroplasty was performed back to stable border.  We then placed the knee in a valgus orientation in order to assess the medial meniscus.  He did have a complex tear of the posterior half of the medial meniscus.  The tear was significantly macerated.  I used a combination of a meniscus basket and oscillating shaver to perform a partial medial meniscectomy back to stable border.  After this was done I then addressed the lateral compartment.  The meniscus was intact but there was one focal area on the extension surface of the lateral femoral condyle that exhibited unstable cartilage.  Chondroplasty was performed back to a stable border.  I then placed the knee in extension to address the patellofemoral compartment.  He also had grade III-IV chondromalacia of the patellofemoral compartment.  There is no unstable cartilage.  I did find 2 loose bodies in the medial gutter which each measured greater than 5 mm.  These were then removed without difficulty.  Excess fluid was then drained from the knee joint.  The incisions were closed with interrupted nylon sutures.  Sterile dressings were applied.  Patient tolerated procedure well had no immediate complications.  Suprapatellar pouch and gutters: moderate synovitis or debris. Patella chondral surface: Grade 4 Trochlear chondral surface: Grade 4 Patellofemoral tracking: normal Medial meniscus: complex tear of posterior horn.  Medial femoral condyle flexion bearing surface: Grade 4 Medial femoral condyle extension bearing surface: Grade 4 Medial tibial plateau: Grade 3 Anterior cruciate ligament:stable Posterior cruciate ligament:stable Lateral meniscus: normal.   Lateral femoral condyle flexion bearing surface: Grade 1 Lateral femoral condyle extension bearing surface: Grade 3 Lateral tibial plateau: Grade 1  DISPOSITION: The patient was awakened from general anesthetic, extubated, taken to the recovery  room in medically stable condition, no  apparent complications. The patient may be weightbearing as tolerated to the operative lower extremity.  Range of motion of right knee as tolerated.  Nathan Cecil, MD [place] 8:34 AM

## 2019-01-16 NOTE — Anesthesia Postprocedure Evaluation (Signed)
Anesthesia Post Note  Patient: Nathan Jones  Procedure(s) Performed: LEFT KNEE ARTHROSCOPY WITH PARTIAL MEDIAL MENISCECTOMY (Left Knee) CHONDROPLASTY AND REMOVAL OF LOOSE BODY (Left Knee)     Patient location during evaluation: PACU Anesthesia Type: General Level of consciousness: awake and alert Pain management: pain level controlled Vital Signs Assessment: post-procedure vital signs reviewed and stable Respiratory status: spontaneous breathing, nonlabored ventilation, respiratory function stable and patient connected to nasal cannula oxygen Cardiovascular status: blood pressure returned to baseline and stable Postop Assessment: no apparent nausea or vomiting Anesthetic complications: no    Last Vitals:  Vitals:   01/16/19 0915 01/16/19 0942  BP: 122/66 134/68  Pulse: 63 68  Resp: 12 (!) 24  Temp:  36.4 C  SpO2: 95% 97%    Last Pain:  Vitals:   01/16/19 0942  TempSrc: Oral  PainSc: 0-No pain                 Vika Buske L Tanaka Gillen

## 2019-01-16 NOTE — Discharge Instructions (Signed)
°Post Anesthesia Home Care Instructions ° °Activity: °Get plenty of rest for the remainder of the day. A responsible individual must stay with you for 24 hours following the procedure.  °For the next 24 hours, DO NOT: °-Drive a car °-Operate machinery °-Drink alcoholic beverages °-Take any medication unless instructed by your physician °-Make any legal decisions or sign important papers. ° °Meals: °Start with liquid foods such as gelatin or soup. Progress to regular foods as tolerated. Avoid greasy, spicy, heavy foods. If nausea and/or vomiting occur, drink only clear liquids until the nausea and/or vomiting subsides. Call your physician if vomiting continues. ° °Special Instructions/Symptoms: °Your throat may feel dry or sore from the anesthesia or the breathing tube placed in your throat during surgery. If this causes discomfort, gargle with warm salt water. The discomfort should disappear within 24 hours. ° °If you had a scopolamine patch placed behind your ear for the management of post- operative nausea and/or vomiting: ° °1. The medication in the patch is effective for 72 hours, after which it should be removed.  Wrap patch in a tissue and discard in the trash. Wash hands thoroughly with soap and water. °2. You may remove the patch earlier than 72 hours if you experience unpleasant side effects which may include dry mouth, dizziness or visual disturbances. °3. Avoid touching the patch. Wash your hands with soap and water after contact with the patch. °  ° ° ° ° ° ° °Post-operative patient instructions  °Knee Arthroscopy  ° °• Ice:  Place intermittent ice or cooler pack over your knee, 30 minutes on and 30 minutes off.  Continue this for the first 72 hours after surgery, then save ice for use after therapy sessions or on more active days.   °• Weight:  You may bear weight on your leg as your symptoms allow. °• Crutches:  Use crutches (or walker) to assist in walking until told to discontinue by your physical  therapist or physician. This will help to reduce pain. °• Strengthening:  Perform simple thigh squeezes (isometric quad contractions) and straight leg lifts as you are able (3 sets of 5 to 10 repetitions, 3 times a day).  For the leg lifts, have someone support under your ankle in the beginning until you have increased strength enough to do this on your own.  To help get started on thigh squeezes, place a pillow under your knee and push down on the pillow with back of knee (sometimes easier to do than with your leg fully straight). °• Motion:  Perform gentle knee motion as tolerated - this is gentle bending and straightening of the knee. Seated heel slides: you can start by sitting in a chair, remove your brace, and gently slide your heel back on the floor - allowing your knee to bend. Have someone help you straighten your knee (or use your other leg/foot hooked under your ankle.  °• Dressing:  Perform 1st dressing change at 2 days postoperative. A moderate amount of blood tinged drainage is to be expected.  So if you bleed through the dressing on the first or second day or if you have fevers, it is fine to change the dressing/check the wounds early and redress wound. Elevate your leg.  If it bleeds through again, or if the incisions are leaking frank blood, please call the office. May change dressing every 1-2 days thereafter to help watch wounds. Can purchase Tegaderm (or 3M Nexcare) water resistant dressings at local pharmacy / [facility]. °• Shower:    Light shower is ok after 2 days.  Please take shower, NO bath. Recover with gauze and ace wrap to help keep wounds protected.   °• Pain medication:  A narcotic pain medication has been prescribed.  Take as directed.  Typically you need narcotic pain medication more regularly during the first 3 to 5 days after surgery.  Decrease your use of the medication as the pain improves.  Narcotics can sometimes cause constipation, even after a few doses.  If you have problems  with constipation, you can take an over the counter stool softener or light laxative.  If you have persistent problems, please notify your physician’s office. °• Physical therapy: Additional activity guidelines to be provided by your physician or physical therapist at follow-up visits.  °• Driving: Do not recommend driving x 2 weeks post surgical, especially if surgery performed on right side. Should not drive while taking narcotic pain medications. It typically takes at least 2 weeks to restore sufficient neuromuscular function for normal reaction times for driving safety.  °• Call [phone] for questions or problems. Evenings you will be forwarded to the hospital operator.  Ask for the orthopaedic physician on call. Please call if you experience:  °  °o Redness, foul smelling, or persistent drainage from the surgical site  °o worsening knee pain and swelling not responsive to medication  °o any calf pain and or swelling of the lower leg  °o temperatures greater than 101.5 F °o other questions or concerns ° ° °Thank you for allowing us to be a part of your care. ° °

## 2019-01-17 ENCOUNTER — Encounter (HOSPITAL_BASED_OUTPATIENT_CLINIC_OR_DEPARTMENT_OTHER): Payer: Self-pay | Admitting: Orthopaedic Surgery

## 2019-01-23 ENCOUNTER — Encounter (INDEPENDENT_AMBULATORY_CARE_PROVIDER_SITE_OTHER): Payer: Self-pay | Admitting: Orthopaedic Surgery

## 2019-01-23 ENCOUNTER — Ambulatory Visit (INDEPENDENT_AMBULATORY_CARE_PROVIDER_SITE_OTHER): Payer: Medicare Other | Admitting: Orthopaedic Surgery

## 2019-01-23 DIAGNOSIS — Z9889 Other specified postprocedural states: Secondary | ICD-10-CM

## 2019-01-23 DIAGNOSIS — Z720 Tobacco use: Secondary | ICD-10-CM

## 2019-01-23 NOTE — Progress Notes (Signed)
Post-Op Visit Note   Patient: Nathan Jones           Date of Birth: 27-Mar-1951           MRN: [id number] Visit Date: 01/23/2019 PCP: Lavone Orn, MD   Assessment & Plan:  Chief Complaint:  Chief Complaint  Patient presents with  . Left Knee - Pain   Visit Diagnoses:  1. S/P left knee arthroscopy     Plan: Patient is a pleasant 68 year old gentleman who presents our clinic today 1 week status post left knee arthroscopic debridement medial meniscus and chondroplasty.  It was noted during operative intervention he had grade 3 and 4 changes patellofemoral compartment as well as the medial femoral condyle.  No fevers or chills.  Examination of his left knee reveals well-healed surgical portals with nylon sutures in place.  No evidence of infection.  Calves are soft and nontender.  He is neurovascular intact distally.  At this point, he will continue to advance with activity as tolerated.  I did provide him with a postoperative knee range of motion and strengthening handout.  He will follow-up with [place] in 5 weeks time for recheck.  Follow-Up Instructions: Return in about 5 weeks (around 02/27/2019).   Orders:  No orders of the defined types were placed in this encounter.  No orders of the defined types were placed in this encounter.   Imaging: No new imaging  PMFS History: Patient Active Problem List   Diagnosis Date Noted  . S/P left knee arthroscopy 01/23/2019  . Synovitis of left knee 01/16/2019  . Bodies, loose, joint, knee, left 01/16/2019  . Acute medial meniscus tear, left, initial encounter 12/28/2018  . CAD in native artery 05/19/2016  . Hyperlipidemia 05/19/2016  . Smoker 05/19/2016  . Abdominal aortic aneurysm (AAA) without rupture ([place]) 05/19/2016  . AAA (abdominal aortic aneurysm) ([place]) 11/11/2015  . Aortoiliac occlusive disease ([place]) 11/11/2015  . CAD (coronary artery disease), native coronary artery 09/08/2015  . S/P coronary artery stent placement 09/08/2015  .  Essential hypertension 09/08/2015  . Dyslipidemia 09/08/2015  . ED (erectile dysfunction) 09/08/2015  . Dizziness 09/08/2015  . Tobacco abuse 09/08/2015   Past Medical History:  Diagnosis Date  . CAD (coronary artery disease)    stent to LAD 2016  . Hyperlipidemia   . Hypertension   . MMT (medial meniscus tear)    left knee  . Skin cancer   . Sleep apnea    uses CPAP nightly  . Smoker    1ppd    Family History  Problem Relation Age of Onset  . Heart disease Father        pacemaker  . Heart disease Maternal Grandfather   . Stroke Maternal Grandmother     Past Surgical History:  Procedure Laterality Date  . APPENDECTOMY  1988  . CARDIAC CATHETERIZATION  06/01/2015   Xience DES to LAD (3.49mmx15mm) - [facility]  . CERVICAL FUSION  2010  . CHOLECYSTECTOMY  1988  . CHONDROPLASTY Left 01/16/2019   Procedure: CHONDROPLASTY AND REMOVAL OF LOOSE BODY;  Surgeon: Leandrew Koyanagi, MD;  Location: [place];  Service: Orthopedics;  Laterality: Left;  . KNEE ARTHROSCOPY WITH MEDIAL MENISECTOMY Left 01/16/2019   Procedure: LEFT KNEE ARTHROSCOPY WITH PARTIAL MEDIAL MENISCECTOMY;  Surgeon: Leandrew Koyanagi, MD;  Location: [place];  Service: Orthopedics;  Laterality: Left;   Social History   Occupational History  . Occupation: Primary school teacher    Comment: retired  Tobacco Use  . Smoking status: Current Every Day Smoker    Packs/day: 1.00    Years: 45.00    Pack years: 45.00    Types: Cigarettes  . Smokeless tobacco: Never Used  Substance and Sexual Activity  . Alcohol use: Yes    Alcohol/week: 0.0 standard drinks    Comment: social   . Drug use: No  . Sexual activity: Yes

## 2019-02-15 DIAGNOSIS — Z85828 Personal history of other malignant neoplasm of skin: Secondary | ICD-10-CM | POA: Diagnosis not present

## 2019-02-15 DIAGNOSIS — C44622 Squamous cell carcinoma of skin of right upper limb, including shoulder: Secondary | ICD-10-CM | POA: Diagnosis not present

## 2019-02-19 DIAGNOSIS — C44622 Squamous cell carcinoma of skin of right upper limb, including shoulder: Secondary | ICD-10-CM | POA: Diagnosis not present

## 2019-02-19 DIAGNOSIS — Z85828 Personal history of other malignant neoplasm of skin: Secondary | ICD-10-CM | POA: Diagnosis not present

## 2019-02-27 ENCOUNTER — Ambulatory Visit (INDEPENDENT_AMBULATORY_CARE_PROVIDER_SITE_OTHER): Payer: Medicare Other | Admitting: Orthopaedic Surgery

## 2019-02-27 ENCOUNTER — Encounter (INDEPENDENT_AMBULATORY_CARE_PROVIDER_SITE_OTHER): Payer: Self-pay | Admitting: Physician Assistant

## 2019-02-27 ENCOUNTER — Other Ambulatory Visit: Payer: Self-pay

## 2019-02-27 DIAGNOSIS — Z9889 Other specified postprocedural states: Secondary | ICD-10-CM

## 2019-02-27 NOTE — Progress Notes (Signed)
Post-Op Visit Note   Patient: Nathan Jones           Date of Birth: 1951/04/18           MRN: [id number] Visit Date: 02/27/2019 PCP: Lavone Orn, MD   Assessment & Plan:  Chief Complaint:  Chief Complaint  Patient presents with  . Left Knee - Pain   Visit Diagnoses:  1. S/P left knee arthroscopy     Plan: Patient is was a 68 year old gentleman who presents for clinic today 42 days status post left knee arthroscopic debridement medial meniscus chondroplasty removal of 2 loose bodies, date of surgery 01/16/2019.  As noted during operative intervention he had grade 3 and 4 changes medial and patellofemoral compartments.  He still has a fair amount of soreness.  He is limping at times.  He has been working on home exercise program.  Examination of his left knee shows no effusion.  Range of motion 0 to 120 degrees medial joint line tenderness.  Moderate patellofemoral crepitus.  Is neurovascular intact distally.  Calf is soft nontender.  At this point, discussed with the patient that he may still have persistent symptoms based on the underlying arthritis found during operative intervention.  He will continue to advance activity as tolerated over the next 6 weeks.  We did discuss viscosupplementation injections as well as definitive treatment of total knee arthroplasty should he fail to get any relief of symptoms over the next several months.  He will follow-up with [place] as needed.  Follow-Up Instructions: Return if symptoms worsen or fail to improve.   Orders:  No orders of the defined types were placed in this encounter.  No orders of the defined types were placed in this encounter.   Imaging: No new imaging  PMFS History: Patient Active Problem List   Diagnosis Date Noted  . S/P left knee arthroscopy 01/23/2019  . Synovitis of left knee 01/16/2019  . Bodies, loose, joint, knee, left 01/16/2019  . Acute medial meniscus tear, left, initial encounter 12/28/2018  . CAD in native artery  05/19/2016  . Hyperlipidemia 05/19/2016  . Smoker 05/19/2016  . Abdominal aortic aneurysm (AAA) without rupture ([place]) 05/19/2016  . AAA (abdominal aortic aneurysm) ([place]) 11/11/2015  . Aortoiliac occlusive disease ([place]) 11/11/2015  . CAD (coronary artery disease), native coronary artery 09/08/2015  . S/P coronary artery stent placement 09/08/2015  . Essential hypertension 09/08/2015  . Dyslipidemia 09/08/2015  . ED (erectile dysfunction) 09/08/2015  . Dizziness 09/08/2015  . Tobacco abuse 09/08/2015   Past Medical History:  Diagnosis Date  . CAD (coronary artery disease)    stent to LAD 2016  . Hyperlipidemia   . Hypertension   . MMT (medial meniscus tear)    left knee  . Skin cancer   . Sleep apnea    uses CPAP nightly  . Smoker    1ppd    Family History  Problem Relation Age of Onset  . Heart disease Father        pacemaker  . Heart disease Maternal Grandfather   . Stroke Maternal Grandmother     Past Surgical History:  Procedure Laterality Date  . APPENDECTOMY  1988  . CARDIAC CATHETERIZATION  06/01/2015   Xience DES to LAD (3.17mmx15mm) - [facility]  . CERVICAL FUSION  2010  . CHOLECYSTECTOMY  1988  . CHONDROPLASTY Left 01/16/2019   Procedure: CHONDROPLASTY AND REMOVAL OF LOOSE BODY;  Surgeon: Leandrew Koyanagi, MD;  Location: [place];  Service: Orthopedics;  Laterality: Left;  . KNEE ARTHROSCOPY WITH MEDIAL MENISECTOMY Left 01/16/2019   Procedure: LEFT KNEE ARTHROSCOPY WITH PARTIAL MEDIAL MENISCECTOMY;  Surgeon: Leandrew Koyanagi, MD;  Location: [place];  Service: Orthopedics;  Laterality: Left;   Social History   Occupational History  . Occupation: Primary school teacher    Comment: retired  Tobacco Use  . Smoking status: Current Every Day Smoker    Packs/day: 1.00    Years: 45.00    Pack years: 45.00    Types: Cigarettes  . Smokeless tobacco: Never Used  Substance and Sexual Activity  . Alcohol use: Yes     Alcohol/week: 0.0 standard drinks    Comment: social   . Drug use: No  . Sexual activity: Yes

## 2019-03-20 ENCOUNTER — Other Ambulatory Visit: Payer: Self-pay

## 2019-03-20 MED ORDER — PRAVASTATIN SODIUM 40 MG PO TABS: 40.0000 mg | ORAL_TABLET | Freq: Every evening | ORAL | 12 refills | Status: DC

## 2019-03-20 MED ORDER — EZETIMIBE 10 MG PO TABS: ORAL_TABLET | ORAL | 6 refills | Status: DC

## 2019-03-21 ENCOUNTER — Other Ambulatory Visit: Payer: Self-pay

## 2019-03-21 MED ORDER — EZETIMIBE 10 MG PO TABS: ORAL_TABLET | ORAL | 6 refills | Status: AC

## 2019-03-22 ENCOUNTER — Other Ambulatory Visit: Payer: Self-pay

## 2019-03-22 MED ORDER — PRAVASTATIN SODIUM 40 MG PO TABS: 40.0000 mg | ORAL_TABLET | Freq: Every evening | ORAL | 12 refills | Status: AC

## 2019-04-05 ENCOUNTER — Telehealth: Payer: Self-pay | Admitting: *Deleted

## 2019-04-05 ENCOUNTER — Encounter: Payer: Self-pay | Admitting: *Deleted

## 2019-04-05 NOTE — Telephone Encounter (Signed)
   Cardiac Questionnaire:    Since your last visit or hospitalization:    1. Have you been having new or worsening chest pain? NO   2. Have you been having new or worsening shortness of breath? NO 3. Have you been having new or worsening leg swelling, wt gain, or increase in abdominal girth (pants fitting more tightly)? NO   4. Have you had any passing out spells? NO    *A YES to any of these questions would result in the appointment being kept. *If all the answers to these questions are NO, we should indicate that given the current situation regarding the worldwide coronarvirus pandemic, at the recommendation of the CDC, we are looking to limit gatherings in our waiting area, and thus will reschedule their appointment beyond four weeks from today.   _____________   COVID-19 Pre-Screening Questions:  . Do you currently have a fever? NO . Have you recently travelled on a cruise, internationally, or to [place], [place], [place], [place], [place], or [place], [place] ([employer])? NO . Have you been in contact with someone that is currently pending confirmation of Covid19 testing or has been confirmed to have the Covid19 virus?  NO Are you currently experiencing fatigue or cough?NO          

## 2019-04-10 ENCOUNTER — Encounter (INDEPENDENT_AMBULATORY_CARE_PROVIDER_SITE_OTHER): Payer: Self-pay | Admitting: Orthopaedic Surgery

## 2019-04-15 ENCOUNTER — Ambulatory Visit: Payer: Medicare Other | Admitting: Internal Medicine

## 2019-04-15 ENCOUNTER — Other Ambulatory Visit: Payer: Self-pay

## 2019-04-15 ENCOUNTER — Ambulatory Visit (INDEPENDENT_AMBULATORY_CARE_PROVIDER_SITE_OTHER): Payer: Medicare Other | Admitting: Orthopaedic Surgery

## 2019-04-15 ENCOUNTER — Encounter (INDEPENDENT_AMBULATORY_CARE_PROVIDER_SITE_OTHER): Payer: Self-pay | Admitting: Orthopaedic Surgery

## 2019-04-15 ENCOUNTER — Ambulatory Visit (INDEPENDENT_AMBULATORY_CARE_PROVIDER_SITE_OTHER): Payer: Medicare Other

## 2019-04-15 VITALS — Ht 75.0 in | Wt 290.0 lb

## 2019-04-15 DIAGNOSIS — M659 Synovitis and tenosynovitis, unspecified: Secondary | ICD-10-CM

## 2019-04-15 DIAGNOSIS — M1712 Unilateral primary osteoarthritis, left knee: Secondary | ICD-10-CM

## 2019-04-15 DIAGNOSIS — I2510 Atherosclerotic heart disease of native coronary artery without angina pectoris: Secondary | ICD-10-CM

## 2019-04-15 DIAGNOSIS — M2342 Loose body in knee, left knee: Secondary | ICD-10-CM

## 2019-04-15 DIAGNOSIS — Z9889 Other specified postprocedural states: Secondary | ICD-10-CM

## 2019-04-15 NOTE — Progress Notes (Deleted)
Post-Op Visit Note   Patient: Nathan Jones           Date of Birth: 1951-01-16           MRN: [id number] Visit Date: 04/15/2019 PCP: Lavone Orn, MD   Assessment & Plan:  Chief Complaint:  Chief Complaint  Patient presents with  . Left Knee - Follow-up    01/16/2019 Left Knee Arthroscopy with Partial Medial Meniscectomy, Chondroplasty and Removal of Loose Body   Visit Diagnoses:  1. S/P left knee arthroscopy   2. Primary osteoarthritis of left knee   3. Bodies, loose, joint, knee, left   4. Synovitis of left knee     Plan: Patient is a pleasant 68 year old gentleman who presents our clinic today 32 days status post left knee arthroscopic debridement medial meniscus chondroplasty removal of multiple loose bodies, date of surgery 01/16/2019.  He is been in a moderate amount of pain since surgery.  It was noted during operative intervention he had diffuse grade 3 and 4 changes throughout, worse medial compartment.  The pain he has primarily to the medial aspect worse with any activity and towards the end of the day.  He is taking over-the-counter anti-inflammatories with minimal relief of symptoms.  Examination of his left knee reveals a trace effusion.  Range of motion 0 to 115 degrees.  Medial joint line tenderness.  Moderate patellofemoral crepitus.  Is neurovascular intact distally.  At this point, he is leaning towards proceeding with definitive treatment of a left total knee arthroplasty.  He does note that he is going on a cruise in October 1 25th wedding anniversary and is trying to decide whether he should have this done before or after his cruise.  Follow-Up Instructions: Return if symptoms worsen or fail to improve.   Orders:  No orders of the defined types were placed in this encounter.  No orders of the defined types were placed in this encounter.   Imaging: No new imaging   PMFS History: Patient Active Problem List   Diagnosis Date Noted  . Primary osteoarthritis of  left knee 04/15/2019  . S/P left knee arthroscopy 01/23/2019  . Synovitis of left knee 01/16/2019  . Bodies, loose, joint, knee, left 01/16/2019  . Acute medial meniscus tear, left, initial encounter 12/28/2018  . CAD in native artery 05/19/2016  . Hyperlipidemia 05/19/2016  . Smoker 05/19/2016  . Abdominal aortic aneurysm (AAA) without rupture ([place]) 05/19/2016  . AAA (abdominal aortic aneurysm) ([place]) 11/11/2015  . Aortoiliac occlusive disease ([place]) 11/11/2015  . CAD (coronary artery disease), native coronary artery 09/08/2015  . S/P coronary artery stent placement 09/08/2015  . Essential hypertension 09/08/2015  . Dyslipidemia 09/08/2015  . ED (erectile dysfunction) 09/08/2015  . Dizziness 09/08/2015  . Tobacco abuse 09/08/2015   Past Medical History:  Diagnosis Date  . CAD (coronary artery disease)    stent to LAD 2016  . Hyperlipidemia   . Hypertension   . MMT (medial meniscus tear)    left knee  . Skin cancer   . Sleep apnea    uses CPAP nightly  . Smoker    1ppd    Family History  Problem Relation Age of Onset  . Heart disease Father        pacemaker  . Heart disease Maternal Grandfather   . Stroke Maternal Grandmother     Past Surgical History:  Procedure Laterality Date  . APPENDECTOMY  1988  . CARDIAC CATHETERIZATION  06/01/2015   Xience DES to  LAD (3.37mmx15mm) - [facility]  . CERVICAL FUSION  2010  . CHOLECYSTECTOMY  1988  . CHONDROPLASTY Left 01/16/2019   Procedure: CHONDROPLASTY AND REMOVAL OF LOOSE BODY;  Surgeon: Leandrew Koyanagi, MD;  Location: [place];  Service: Orthopedics;  Laterality: Left;  . KNEE ARTHROSCOPY WITH MEDIAL MENISECTOMY Left 01/16/2019   Procedure: LEFT KNEE ARTHROSCOPY WITH PARTIAL MEDIAL MENISCECTOMY;  Surgeon: Leandrew Koyanagi, MD;  Location: [place];  Service: Orthopedics;  Laterality: Left;   Social History   Occupational History  . Occupation: Primary school teacher    Comment: retired   Tobacco Use  . Smoking status: Current Every Day Smoker    Packs/day: 1.00    Years: 45.00    Pack years: 45.00    Types: Cigarettes  . Smokeless tobacco: Never Used  Substance and Sexual Activity  . Alcohol use: Yes    Alcohol/week: 0.0 standard drinks    Comment: social   . Drug use: No  . Sexual activity: Yes

## 2019-04-15 NOTE — Progress Notes (Signed)
Office Visit Note   Patient: Nathan Jones           Date of Birth: June 11, 1951           MRN: [id number] Visit Date: 04/15/2019              Requested by: Lavone Orn, MD [address] [place] [address] [place], [place] [address] PCP: Lavone Orn, MD   Assessment & Plan: Visit Diagnoses:  1. S/P left knee arthroscopy   2. Primary osteoarthritis of left knee   3. Bodies, loose, joint, knee, left   4. Synovitis of left knee     Plan: Impression is end-stage degenerative joint disease left knee. At this point, patient would like to proceed with definitive treatment of a left total knee arthroplasty.  Risks, benefits and possible complications reviewed.  Rehab and recovery time discussed.  All questions were answered.  He does note that he is going on a cruise on October 11th for his 25th wedding anniversary and would like to have surgery prior to going on the cruise.  He denies history of nickel allergy or DVT.  He takes baby aspirin daily.  Follow-Up Instructions: Return for 2 weeks post-op.   Orders:  Orders Placed This Encounter  Procedures  . XR KNEE 3 VIEW LEFT   No orders of the defined types were placed in this encounter.     Procedures: No procedures performed   Clinical Data: No additional findings.   Subjective: Chief Complaint  Patient presents with  . Left Knee - Follow-up    01/16/2019 Left Knee Arthroscopy with Partial Medial Meniscectomy, Chondroplasty and Removal of Loose Body    HPI Patient is a pleasant 68 year old gentleman who presents our clinic today 89 days status post left knee arthroscopic debridement medial meniscus chondroplasty removal of multiple loose bodies, date of surgery 01/16/2019.  He is been in a moderate amount of pain since surgery.  It was noted during operative intervention he had diffuse grade 3 and 4 changes throughout, worse medial compartment.  The pain he has primarily to the medial aspect worse with any activity and towards the end  of the day.  He is taking over-the-counter anti-inflammatories with minimal relief of symptoms.      Review of Systems as detailed in HPI.  All others reviewed and are negative.   Objective: Vital Signs: Ht 6\' 3"  (1.905 m)   Wt 290 lb (131.5 kg)   BMI 36.25 kg/m   Physical Exam well-developed and well-nourished gentleman no acute distress.  Alert and oriented x3.  Ortho Exam Examination of his left knee reveals a trace effusion.  Range of motion 0 to 115 degrees.  Medial joint line tenderness.  Moderate patellofemoral crepitus.  Is neurovascular intact distally.  Specialty Comments:  No specialty comments available.  Imaging:    PMFS History: Patient Active Problem List   Diagnosis Date Noted  . Primary osteoarthritis of left knee 04/15/2019  . S/P left knee arthroscopy 01/23/2019  . Synovitis of left knee 01/16/2019  . Bodies, loose, joint, knee, left 01/16/2019  . Acute medial meniscus tear, left, initial encounter 12/28/2018  . CAD in native artery 05/19/2016  . Hyperlipidemia 05/19/2016  . Smoker 05/19/2016  . Abdominal aortic aneurysm (AAA) without rupture ([place]) 05/19/2016  . AAA (abdominal aortic aneurysm) ([place]) 11/11/2015  . Aortoiliac occlusive disease ([place]) 11/11/2015  . CAD (coronary artery disease), native coronary artery 09/08/2015  . S/P coronary artery stent placement 09/08/2015  . Essential hypertension  09/08/2015  . Dyslipidemia 09/08/2015  . ED (erectile dysfunction) 09/08/2015  . Dizziness 09/08/2015  . Tobacco abuse 09/08/2015   Past Medical History:  Diagnosis Date  . CAD (coronary artery disease)    stent to LAD 2016  . Hyperlipidemia   . Hypertension   . MMT (medial meniscus tear)    left knee  . Skin cancer   . Sleep apnea    uses CPAP nightly  . Smoker    1ppd    Family History  Problem Relation Age of Onset  . Heart disease Father        pacemaker  . Heart disease Maternal Grandfather   . Stroke Maternal Grandmother     Past  Surgical History:  Procedure Laterality Date  . APPENDECTOMY  1988  . CARDIAC CATHETERIZATION  06/01/2015   Xience DES to LAD (3.13mmx15mm) - [facility]  . CERVICAL FUSION  2010  . CHOLECYSTECTOMY  1988  . CHONDROPLASTY Left 01/16/2019   Procedure: CHONDROPLASTY AND REMOVAL OF LOOSE BODY;  Surgeon: Leandrew Koyanagi, MD;  Location: [place];  Service: Orthopedics;  Laterality: Left;  . KNEE ARTHROSCOPY WITH MEDIAL MENISECTOMY Left 01/16/2019   Procedure: LEFT KNEE ARTHROSCOPY WITH PARTIAL MEDIAL MENISCECTOMY;  Surgeon: Leandrew Koyanagi, MD;  Location: [place];  Service: Orthopedics;  Laterality: Left;   Social History   Occupational History  . Occupation: Primary school teacher    Comment: retired  Tobacco Use  . Smoking status: Current Every Day Smoker    Packs/day: 1.00    Years: 45.00    Pack years: 45.00    Types: Cigarettes  . Smokeless tobacco: Never Used  Substance and Sexual Activity  . Alcohol use: Yes    Alcohol/week: 0.0 standard drinks    Comment: social   . Drug use: No  . Sexual activity: Yes

## 2019-04-25 DIAGNOSIS — L821 Other seborrheic keratosis: Secondary | ICD-10-CM | POA: Diagnosis not present

## 2019-04-25 DIAGNOSIS — C44629 Squamous cell carcinoma of skin of left upper limb, including shoulder: Secondary | ICD-10-CM | POA: Diagnosis not present

## 2019-04-25 DIAGNOSIS — D1801 Hemangioma of skin and subcutaneous tissue: Secondary | ICD-10-CM | POA: Diagnosis not present

## 2019-04-25 DIAGNOSIS — L814 Other melanin hyperpigmentation: Secondary | ICD-10-CM | POA: Diagnosis not present

## 2019-04-25 DIAGNOSIS — L570 Actinic keratosis: Secondary | ICD-10-CM | POA: Diagnosis not present

## 2019-04-25 DIAGNOSIS — Z85828 Personal history of other malignant neoplasm of skin: Secondary | ICD-10-CM | POA: Diagnosis not present

## 2019-04-25 DIAGNOSIS — D225 Melanocytic nevi of trunk: Secondary | ICD-10-CM | POA: Diagnosis not present

## 2019-05-16 ENCOUNTER — Encounter: Payer: Self-pay | Admitting: Orthopaedic Surgery

## 2019-05-17 NOTE — Telephone Encounter (Signed)
I am unsure about this as well?

## 2019-05-20 NOTE — Pre-Procedure Instructions (Addendum)
Nathan Jones  05/20/2019     [facility] [place], [place] [address] [place] [address] Phone: [phone] Fax: [phone]  [facility] [place] [place] [facility] [address] [facility] [employer] [place] [address] Phone: [phone] Fax: [phone]   Your procedure is scheduled on Friday, June 5th  Report to [facility] at 10 A.M.  Call this number if you have problems the morning of surgery:  [phone]   Remember:  Do not eat after midnight.  You may drink clear liquids until 9:00 A.M. .  Clear liquids allowed are: Water, Juice (non-citric and without pulp), Carbonated beverages, Clear Tea, Black Coffee only, Plain Jell-O only, Gatorade and Plain Popsicles only    Take these medicines the morning of surgery with A SIP OF WATER  ezetimibe (ZETIA)  If needed - ValACYclovir HCl (VALTREX PO)  Please complete your PRE-SURGERY ENSURE that was provided to you by 9 A.M. the day of surgery  Please, if able, drink it in one setting. DO NOT SIP.  Follow your surgeon's instructions on when to stop Aspirin.  If no instructions were given by your surgeon then you will need to call the office to get those instructions.    7 days prior to surgery STOP taking any Aspirin (unless otherwise instructed by your surgeon), Aleve, Naproxen, Ibuprofen, Motrin, Advil, Goody's, BC's, all herbal medications, fish oil, and all vitamins.    Do not wear jewelry, make-up or nail polish.  Do not wear lotions, powders, or cologne, or deodorant.  Do not shave 48 hours prior to surgery.  Men may shave face and neck.  Do not bring valuables to the hospital.  [facility] is not responsible for any belongings or valuables.  Contacts, dentures or bridgework may not be worn into surgery.  Leave your suitcase in the car.  After surgery it may be brought to your room.  For  patients admitted to the hospital, discharge time will be determined by your treatment team.   Special instructions: [place]- Preparing For Surgery  Before surgery, you can play an important role. Because skin is not sterile, your skin needs to be as free of germs as possible. You can reduce the number of germs on your skin by washing with CHG (chlorahexidine gluconate) Soap before surgery.  CHG is an antiseptic cleaner which kills germs and bonds with the skin to continue killing germs even after washing.    Oral Hygiene is also important to reduce your risk of infection.  Remember - BRUSH YOUR TEETH THE MORNING OF SURGERY WITH YOUR REGULAR TOOTHPASTE  Please do not use if you have an allergy to CHG or antibacterial soaps. If your skin becomes reddened/irritated stop using the CHG.  Do not shave (including legs and underarms) for at least 48 hours prior to first CHG shower. It is OK to shave your face.  Please follow these instructions carefully.   1. Shower the NIGHT BEFORE SURGERY and the MORNING OF SURGERY with CHG.   2. If you chose to wash your hair, wash your hair first as usual with your normal shampoo.  3. After you shampoo, rinse your hair and body thoroughly to remove the shampoo.  4. Use CHG as you would any other liquid soap. You can apply CHG directly to the skin and wash gently with a scrungie or a  clean washcloth.   5. Apply the CHG Soap to your body ONLY FROM THE NECK DOWN.  Do not use on open wounds or open sores. Avoid contact with your eyes, ears, mouth and genitals (private parts). Wash Face and genitals (private parts)  with your normal soap.  6. Wash thoroughly, paying special attention to the area where your surgery will be performed.  7. Thoroughly rinse your body with warm water from the neck down.  8. DO NOT shower/wash with your normal soap after using and rinsing off the CHG Soap.  9. Pat yourself dry with a CLEAN TOWEL.  10. Wear CLEAN PAJAMAS to bed  the night before surgery, wear comfortable clothes the morning of surgery  11. Place CLEAN SHEETS on your bed the night of your first shower and DO NOT SLEEP WITH PETS.  Day of Surgery:  Do not apply any deodorants/lotions.  Please wear clean clothes to the hospital/surgery center.   Remember to brush your teeth WITH YOUR REGULAR TOOTHPASTE.  Please read over the following fact sheets that you were given. Pain Booklet, Coughing and Deep Breathing, MRSA Information and Surgical Site Infection Prevention

## 2019-05-21 ENCOUNTER — Other Ambulatory Visit: Payer: Self-pay

## 2019-05-21 ENCOUNTER — Ambulatory Visit (HOSPITAL_COMMUNITY)
Admission: RE | Admit: 2019-05-21 | Discharge: 2019-05-21 | Disposition: A | Discharge status: Home / Self Care | Payer: Medicare Other | Source: Ambulatory Visit | Attending: Physician Assistant | Admitting: Physician Assistant

## 2019-05-21 ENCOUNTER — Other Ambulatory Visit (HOSPITAL_COMMUNITY)
Admission: RE | Admit: 2019-05-21 | Discharge: 2019-05-21 | Disposition: A | Discharge status: Home / Self Care | Payer: Medicare Other | Source: Ambulatory Visit | Attending: Orthopaedic Surgery | Admitting: Orthopaedic Surgery

## 2019-05-21 ENCOUNTER — Other Ambulatory Visit: Payer: Self-pay | Admitting: *Deleted

## 2019-05-21 ENCOUNTER — Encounter (HOSPITAL_COMMUNITY): Payer: Self-pay

## 2019-05-21 ENCOUNTER — Telehealth: Payer: Self-pay | Admitting: *Deleted

## 2019-05-21 ENCOUNTER — Encounter (HOSPITAL_COMMUNITY)
Admission: RE | Admit: 2019-05-21 | Discharge: 2019-05-21 | Disposition: A | Discharge status: Home / Self Care | Payer: Medicare Other | Source: Ambulatory Visit | Attending: Orthopaedic Surgery | Admitting: Orthopaedic Surgery

## 2019-05-21 DIAGNOSIS — M1712 Unilateral primary osteoarthritis, left knee: Secondary | ICD-10-CM

## 2019-05-21 DIAGNOSIS — Z1159 Encounter for screening for other viral diseases: Secondary | ICD-10-CM | POA: Insufficient documentation

## 2019-05-21 DIAGNOSIS — M25862 Other specified joint disorders, left knee: Secondary | ICD-10-CM | POA: Diagnosis not present

## 2019-05-21 DIAGNOSIS — Z01818 Encounter for other preprocedural examination: Secondary | ICD-10-CM | POA: Diagnosis not present

## 2019-05-21 HISTORY — DX: Abdominal aortic aneurysm, without rupture: I71.4

## 2019-05-21 HISTORY — DX: Abdominal aortic aneurysm, without rupture, unspecified: I71.40

## 2019-05-21 HISTORY — DX: Acute myocardial infarction, unspecified: I21.9

## 2019-05-21 HISTORY — DX: Unspecified osteoarthritis, unspecified site: M19.90

## 2019-05-21 HISTORY — DX: Depression, unspecified: F32.A

## 2019-05-21 LAB — COMPREHENSIVE METABOLIC PANEL
ALT: 26 U/L (ref 0–44)
AST: 21 U/L (ref 15–41)
Albumin: 3.8 g/dL (ref 3.5–5.0)
Alkaline Phosphatase: 54 U/L (ref 38–126)
Anion gap: 11 (ref 5–15)
BUN: 22 mg/dL (ref 8–23)
CO2: 18 mmol/L — ABNORMAL LOW (ref 22–32)
Calcium: 9.5 mg/dL (ref 8.9–10.3)
Chloride: 108 mmol/L (ref 98–111)
Creatinine, Ser: 1.11 mg/dL (ref 0.61–1.24)
GFR calc Af Amer: >60 mL/min (ref >60)
GFR calc non Af Amer: >60 mL/min (ref >60)
Glucose, Bld: 99 mg/dL (ref 70–99)
Potassium: 4.4 mmol/L (ref 3.5–5.1)
Sodium: 137 mmol/L (ref 135–145)
Total Bilirubin: 0.9 mg/dL (ref 0.3–1.2)
Total Protein: 6.5 g/dL (ref 6.5–8.1)

## 2019-05-21 LAB — CBC WITH DIFFERENTIAL/PLATELET
Abs Immature Granulocytes: 0.05 10*3/uL (ref 0.00–0.07)
Basophils Absolute: 0 10*3/uL (ref 0.0–0.1)
Basophils Relative: 1 %
Eosinophils Absolute: 0.2 10*3/uL (ref 0.0–0.5)
Eosinophils Relative: 2 %
HCT: 52.3 % — ABNORMAL HIGH (ref 39.0–52.0)
Hemoglobin: 17.2 g/dL — ABNORMAL HIGH (ref 13.0–17.0)
Immature Granulocytes: 1 %
Lymphocytes Relative: 27 %
Lymphs Abs: 2.3 10*3/uL (ref 0.7–4.0)
MCH: 29.4 pg (ref 26.0–34.0)
MCHC: 32.9 g/dL (ref 30.0–36.0)
MCV: 89.4 fL (ref 80.0–100.0)
Monocytes Absolute: 0.6 10*3/uL (ref 0.1–1.0)
Monocytes Relative: 7 %
Neutro Abs: 5.5 10*3/uL (ref 1.7–7.7)
Neutrophils Relative %: 62 %
Platelets: 148 10*3/uL — ABNORMAL LOW (ref 150–400)
RBC: 5.85 MIL/uL — ABNORMAL HIGH (ref 4.22–5.81)
RDW: 12.9 % (ref 11.5–15.5)
WBC: 8.8 10*3/uL (ref 4.0–10.5)
nRBC: 0 % (ref 0.0–0.2)

## 2019-05-21 LAB — PROTIME-INR
INR: 1.2 (ref 0.8–1.2)
Prothrombin Time: 14.9 seconds (ref 11.4–15.2)

## 2019-05-21 LAB — APTT: aPTT: 30 seconds (ref 24–36)

## 2019-05-21 LAB — TYPE AND SCREEN
ABO/RH(D): A POS
Antibody Screen: NEGATIVE

## 2019-05-21 LAB — SURGICAL PCR SCREEN
MRSA, PCR: NEGATIVE
Staphylococcus aureus: NEGATIVE

## 2019-05-21 LAB — ABO/RH: ABO/RH(D): A POS

## 2019-05-21 NOTE — Progress Notes (Signed)
PCP - Lavone Orn Cardiologist - Dr. Debara Pickett  Chest x-ray - 05/21/19 EKG - 01/15/19 Stress Test -11/07/17  ECHO - 08/04/17 Cardiac Cath - 05/24/15; stent to the LAD  Sleep Study - OSA+ CPAP - uses nightly; instructed patient to bring CPAP on DOS.   Blood Thinner Instructions:N/A Aspirin Instructions:LD 05/20/19  Anesthesia review: Yes, hx of CAD.   Patient denies shortness of breath, fever, cough and chest pain at PAT appointment   Patient verbalized understanding of instructions that were given to them at the PAT appointment. Patient was also instructed that they will need to review over the PAT instructions again at home before surgery.  Patient having Covid-19 testing today after PAT appointment.    Coronavirus Screening  Have you experienced the following symptoms:  Cough yes/no: No Fever (>100.81F)  yes/no: No Runny nose yes/no: No Sore throat yes/no: No Difficulty breathing/shortness of breath  yes/no: No  Have you or a family member traveled in the last 14 days and where? yes/no: No   If the patient indicates "YES" to the above questions, their PAT will be rescheduled to limit the exposure to others and, the surgeon will be notified. THE PATIENT WILL NEED TO BE ASYMPTOMATIC FOR 14 DAYS.   If the patient is not experiencing any of these symptoms, the PAT nurse will instruct them to NOT bring anyone with them to their appointment since they may have these symptoms or traveled as well.   Please remind your patients and families that hospital visitation restrictions are in effect and the importance of the restrictions.

## 2019-05-21 NOTE — Care Plan (Signed)
RNCM spoke with patient for pre-op call to discuss Ortho Bundle case management needs. Patient has family that will assist once discharged. He needs a FWW as well as a BSC for DME. Anticipate HHPT upon discharge home. Choice provided. Will continue to follow for any Ortho Bundle Case management needs. All questions answered.

## 2019-05-22 ENCOUNTER — Encounter (HOSPITAL_COMMUNITY): Payer: Self-pay

## 2019-05-22 LAB — NOVEL CORONAVIRUS, NAA (HOSP ORDER, SEND-OUT TO REF LAB; TAT 18-24 HRS): SARS-CoV-2, NAA: NOT DETECTED

## 2019-05-22 NOTE — Progress Notes (Signed)
Anesthesia Chart Review:  Case:  [id number] Date/Time:  05/24/19 1145   Procedure:  LEFT TOTAL KNEE ARTHROPLASTY (Left Knee)   Anesthesia type:  Spinal   Pre-op diagnosis:  left knee degenerative joint disease   Location:  [facility] OR ROOM 05 / [place]   Surgeon:  Leandrew Koyanagi, MD      DISCUSSION: Patient is a 68 year old male scheduled for the above procedure.  History includes smoking, HTN, HLD, CAD (NSTEMI, s/p DES LAD 06/01/15), OSA (CPAP), AAA (3.4 cm 01/15/19)  Last cardiology visit 01/15/19 with Jory Sims, DNP for follow-up and presurgical cardiac clearance prior to his 01/16/19 knee arthroscopy. Last stress test 10/2017. Patient was felt to be acceptable risk for that procedure.  Last ASA 05/20/19. Negative pre-surgical COVID test on 05/21/19. If no acute changes then I anticipate that he can proceed as planned.   VS: BP 132/66   Pulse 99   Temp 37.2 C   Resp 20   Ht 6\' 3"  (1.905 m)   Wt 121.1 kg   SpO2 96%   BMI 33.37 kg/m    PROVIDERS: Lavone Orn, MD is PCP Debara Pickett Nadean Corwin, MD is cardiologist   LABS: Labs reviewed: Acceptable for surgery. (all labs ordered are listed, but only abnormal results are displayed)  Labs Reviewed  CBC WITH DIFFERENTIAL/PLATELET - Abnormal; Notable for the following components:      Result Value   RBC 5.85 (*)    Hemoglobin 17.2 (*)    HCT 52.3 (*)    Platelets 148 (*)    All other components within normal limits  COMPREHENSIVE METABOLIC PANEL - Abnormal; Notable for the following components:   CO2 18 (*)    All other components within normal limits  SURGICAL PCR SCREEN  APTT  PROTIME-INR  TYPE AND SCREEN  ABO/RH    IMAGES: CXR 05/21/19: IMPRESSION: No active cardiopulmonary disease.   EKG: 01/15/19 ([facility]): SB at 58 BPM. PACs. Low voltage QRS.   CV: AAA [place] 01/15/19: Summary: Abdominal Aorta: There is evidence of abnormal dilatation of the Mid and Distal Abdominal aorta. There is evidence of abnormal dilation of  the Right Common Iliac artery. The largest aortic measurement is 3.4 cm. The largest aortic diameter remains  essentially unchanged compared to prior exam. Previous diameter measurement was 3.3 cm obtained on 01/03/2018. Patent IVC. Suggest follow up study in 12 months.  Cardiac event monitor 07/04/18-08/02/18:  Study Highlights: Monitor shows sinus rhythm with sinus bradycardia and PACs.  No significant ventricular arrhythmias, heart block or atrial fibrillation.  Nuclear stress test 11/07/17:  The left ventricular ejection fraction is mildly decreased (45-54%).  Nuclear stress EF: 54%.  There was no ST segment deviation noted during stress.  The study is normal.  This is a low risk study. Low risk stress nuclear study with normal perfusion and normal left ventricular regional and global systolic function.  Cardiac cath/PCI 06/01/15 [facility]): Coronary Angiography: 1. LM: Moderate size and appears relatively normal. Slight distal tapering. Subdivides into the LAD and LCX. 2. LCX: Give off a high OM1 branch which appears normal then continues in the AV groove, giving off a smaller OM branch distally. CX system is normal. 3. LAD: Moderate to large size vessel. Gives off a medium-caliber D1. The vessel then has a diffuse area of stenosis between the D1 and D2 branches with a hazy, 70 to 80% stenosis just after the takeoff of the first septal branch.  After the large D2 there is  mild diffuse disease in the LAD quantified up to 20 to 30%.  Distally and apical LAD is small and barely reaches the apex. 4. RCA: Large, dominant vessel with liminal irregularities throughout. No focal stenoses. RPDA is small.  A significant portion of the inferior wall is supplied by an RV marginal branch.  There are 2 small PLA branches which appear normal. 5. Hemodynamics: Left ventricular pressure was 120 with a left ventricular end-diastolic pressure of 40 mmHg.  Aortic pressure was 120/60. 6. LV:  Left ventricular systolic function appears to be preserved, with an estimated LVEF of 55 to 60%.  No wall motion abnormalities were appreciated. IMPRESSION:  1.  Single-vessel CAD. S/p High-grade proximal LAD stenosis treated with 3.25 x 15 Xience DES. 2.  Mild disease of the LCX and RCA. 3.  Normal left ventricular systolic function.   Past Medical History:  Diagnosis Date  . AAA (abdominal aortic aneurysm) (HCC)    3.4 cm 12/2018  . Arthritis   . CAD (coronary artery disease)    stent to LAD 2016  . Depression   . Hyperlipidemia   . Hypertension   . MMT (medial meniscus tear)    left knee  . Myocardial infarction ([place])    in 2016; stent to LAD.   Marland Kitchen Skin cancer   . Sleep apnea    uses CPAP nightly  . Smoker    1ppd    Past Surgical History:  Procedure Laterality Date  . ACHILLES TENDON SURGERY Left 2018   Dr. Berenice Primas  . APPENDECTOMY  1988  . CARDIAC CATHETERIZATION  06/01/2015   Xience DES to LAD (3.32mmx15mm) - [facility]  . CERVICAL FUSION  2010  . CHOLECYSTECTOMY  1988  . CHONDROPLASTY Left 01/16/2019   Procedure: CHONDROPLASTY AND REMOVAL OF LOOSE BODY;  Surgeon: Leandrew Koyanagi, MD;  Location: [place];  Service: Orthopedics;  Laterality: Left;  . KNEE ARTHROSCOPY WITH MEDIAL MENISECTOMY Left 01/16/2019   Procedure: LEFT KNEE ARTHROSCOPY WITH PARTIAL MEDIAL MENISCECTOMY;  Surgeon: Leandrew Koyanagi, MD;  Location: [place];  Service: Orthopedics;  Laterality: Left;    MEDICATIONS: . aspirin EC 81 MG tablet  . ezetimibe (ZETIA) 10 MG tablet  . ibuprofen (ADVIL) 200 MG tablet  . Ibuprofen-diphenhydrAMINE HCl (ADVIL PM) 200-25 MG CAPS  . lisinopril (PRINIVIL,ZESTRIL) 20 MG tablet  . metoprolol succinate (TOPROL-XL) 25 MG 24 hr tablet  . pravastatin (PRAVACHOL) 40 MG tablet  . sertraline (ZOLOFT) 50 MG tablet  . ValACYclovir HCl (VALTREX PO)   No current facility-administered medications for this encounter.     Myra Gianotti, PA-C Surgical Short Stay/Anesthesiology [facility] Phone [phone] [facility] Phone [phone] 05/22/2019 12:38 PM

## 2019-05-22 NOTE — Anesthesia Preprocedure Evaluation (Addendum)
Anesthesia Evaluation  Patient identified by MRN, date of birth, ID band Patient awake    Reviewed: Allergy & Precautions, NPO status , Patient's Chart, lab work & pertinent test results  Airway Mallampati: III  TM Distance: >3 FB Neck ROM: Full    Dental  (+) Teeth Intact, Dental Advisory Given   Pulmonary Current Smoker,    breath sounds clear to auscultation       Cardiovascular hypertension,  Rhythm:Regular Rate:Normal     Neuro/Psych    GI/Hepatic   Endo/Other    Renal/GU      Musculoskeletal   Abdominal   Peds  Hematology   Anesthesia Other Findings   Reproductive/Obstetrics                            Anesthesia Physical Anesthesia Plan  ASA: III  Anesthesia Plan: Spinal   Post-op Pain Management:    Induction: Intravenous  PONV Risk Score and Plan: Ondansetron and Dexamethasone  Airway Management Planned: Natural Airway and Simple Face Mask  Additional Equipment:   Intra-op Plan:   Post-operative Plan:   Informed Consent: I have reviewed the patients History and Physical, chart, labs and discussed the procedure including the risks, benefits and alternatives for the proposed anesthesia with the patient or authorized representative who has indicated his/her understanding and acceptance.     Dental advisory given  Plan Discussed with: Anesthesiologist and CRNA  Anesthesia Plan Comments: (PAT note written 05/22/2019 by Myra Gianotti, PA-C. )       Anesthesia Quick Evaluation

## 2019-05-23 MED ORDER — DEXTROSE 5 % IV SOLN
3.0000 g | INTRAVENOUS | Status: AC
  Administered 2019-05-24: 3 g via INTRAVENOUS
  Filled 2019-05-23: qty 3000

## 2019-05-23 MED ORDER — TRANEXAMIC ACID-NACL 1000-0.7 MG/100ML-% IV SOLN
1000.0000 mg | INTRAVENOUS | Status: AC
  Administered 2019-05-24: 1000 mg via INTRAVENOUS
  Filled 2019-05-23: qty 100

## 2019-05-23 MED ORDER — TRANEXAMIC ACID 1000 MG/10ML IV SOLN
2000.0000 mg | INTRAVENOUS | Status: AC
  Administered 2019-05-24: 13:00:00 2000 mg via TOPICAL
  Filled 2019-05-23: qty 20

## 2019-05-23 MED ORDER — BUPIVACAINE LIPOSOME 1.3 % IJ SUSP
20.0000 mL | INTRAMUSCULAR | Status: AC
  Administered 2019-05-24: 20 mL
  Filled 2019-05-23: qty 20

## 2019-05-24 ENCOUNTER — Ambulatory Visit (HOSPITAL_COMMUNITY): Payer: Medicare Other | Admitting: Anesthesiology

## 2019-05-24 ENCOUNTER — Encounter (HOSPITAL_COMMUNITY)
Admission: RE | Disposition: A | Discharge status: Home / Self Care | Payer: Self-pay | Source: Home / Self Care | Attending: Orthopaedic Surgery

## 2019-05-24 ENCOUNTER — Observation Stay (HOSPITAL_COMMUNITY)
Admission: RE | Admit: 2019-05-24 | Discharge: 2019-05-25 | Disposition: A | Discharge status: Home / Self Care | Payer: Medicare Other | Attending: Orthopaedic Surgery | Admitting: Orthopaedic Surgery

## 2019-05-24 ENCOUNTER — Other Ambulatory Visit: Payer: Self-pay

## 2019-05-24 ENCOUNTER — Ambulatory Visit (HOSPITAL_COMMUNITY): Payer: Medicare Other | Admitting: Vascular Surgery

## 2019-05-24 ENCOUNTER — Ambulatory Visit (HOSPITAL_COMMUNITY): Payer: Medicare Other

## 2019-05-24 ENCOUNTER — Encounter (HOSPITAL_COMMUNITY): Payer: Self-pay

## 2019-05-24 DIAGNOSIS — I10 Essential (primary) hypertension: Secondary | ICD-10-CM | POA: Diagnosis not present

## 2019-05-24 DIAGNOSIS — G4730 Sleep apnea, unspecified: Secondary | ICD-10-CM | POA: Insufficient documentation

## 2019-05-24 DIAGNOSIS — Z955 Presence of coronary angioplasty implant and graft: Secondary | ICD-10-CM | POA: Diagnosis not present

## 2019-05-24 DIAGNOSIS — Z7982 Long term (current) use of aspirin: Secondary | ICD-10-CM | POA: Insufficient documentation

## 2019-05-24 DIAGNOSIS — F17210 Nicotine dependence, cigarettes, uncomplicated: Secondary | ICD-10-CM | POA: Diagnosis not present

## 2019-05-24 DIAGNOSIS — F329 Major depressive disorder, single episode, unspecified: Secondary | ICD-10-CM | POA: Insufficient documentation

## 2019-05-24 DIAGNOSIS — M25762 Osteophyte, left knee: Secondary | ICD-10-CM | POA: Insufficient documentation

## 2019-05-24 DIAGNOSIS — I252 Old myocardial infarction: Secondary | ICD-10-CM | POA: Insufficient documentation

## 2019-05-24 DIAGNOSIS — Z79899 Other long term (current) drug therapy: Secondary | ICD-10-CM | POA: Insufficient documentation

## 2019-05-24 DIAGNOSIS — E785 Hyperlipidemia, unspecified: Secondary | ICD-10-CM | POA: Diagnosis not present

## 2019-05-24 DIAGNOSIS — I714 Abdominal aortic aneurysm, without rupture: Secondary | ICD-10-CM | POA: Insufficient documentation

## 2019-05-24 DIAGNOSIS — I2510 Atherosclerotic heart disease of native coronary artery without angina pectoris: Secondary | ICD-10-CM | POA: Diagnosis not present

## 2019-05-24 DIAGNOSIS — M1712 Unilateral primary osteoarthritis, left knee: Secondary | ICD-10-CM | POA: Diagnosis not present

## 2019-05-24 DIAGNOSIS — M25562 Pain in left knee: Secondary | ICD-10-CM | POA: Diagnosis not present

## 2019-05-24 DIAGNOSIS — Z96652 Presence of left artificial knee joint: Secondary | ICD-10-CM

## 2019-05-24 HISTORY — PX: TOTAL KNEE ARTHROPLASTY: SHX125

## 2019-05-24 SURGERY — ARTHROPLASTY, KNEE, TOTAL
Anesthesia: Spinal | Site: Knee | Laterality: Left

## 2019-05-24 MED ORDER — LACTATED RINGERS IV SOLN: INTRAVENOUS | Status: DC

## 2019-05-24 MED ORDER — MAGNESIUM CITRATE PO SOLN: 1.0000 | Freq: Once | ORAL | Status: DC | PRN

## 2019-05-24 MED ORDER — ACETAMINOPHEN 325 MG PO TABS: 325.0000 mg | ORAL_TABLET | Freq: Four times a day (QID) | ORAL | Status: DC | PRN

## 2019-05-24 MED ORDER — PROPOFOL 500 MG/50ML IV EMUL
INTRAVENOUS | Status: DC | PRN
  Administered 2019-05-24: 50 ug/kg/min via INTRAVENOUS

## 2019-05-24 MED ORDER — CHLORHEXIDINE GLUCONATE 4 % EX LIQD: 60.0000 mL | Freq: Once | CUTANEOUS | Status: DC

## 2019-05-24 MED ORDER — METOCLOPRAMIDE HCL 5 MG/ML IJ SOLN: 5.0000 mg | Freq: Three times a day (TID) | INTRAMUSCULAR | Status: DC | PRN

## 2019-05-24 MED ORDER — SERTRALINE HCL 50 MG PO TABS
50.0000 mg | ORAL_TABLET | Freq: Every evening | ORAL | Status: DC
  Administered 2019-05-24: 50 mg via ORAL
  Filled 2019-05-24: qty 1

## 2019-05-24 MED ORDER — SENNOSIDES-DOCUSATE SODIUM 8.6-50 MG PO TABS: 1.0000 | ORAL_TABLET | Freq: Every evening | ORAL | 1 refills | Status: DC | PRN

## 2019-05-24 MED ORDER — ONDANSETRON HCL 4 MG/2ML IJ SOLN: 4.0000 mg | Freq: Four times a day (QID) | INTRAMUSCULAR | Status: DC | PRN

## 2019-05-24 MED ORDER — ONDANSETRON HCL 4 MG PO TABS: 4.0000 mg | ORAL_TABLET | Freq: Four times a day (QID) | ORAL | Status: DC | PRN

## 2019-05-24 MED ORDER — OXYCODONE HCL 5 MG PO TABS: 5.0000 mg | ORAL_TABLET | Freq: Three times a day (TID) | ORAL | 0 refills | Status: DC | PRN

## 2019-05-24 MED ORDER — KETOROLAC TROMETHAMINE 15 MG/ML IJ SOLN
30.0000 mg | Freq: Four times a day (QID) | INTRAMUSCULAR | Status: AC
  Administered 2019-05-24 – 2019-05-25 (×4): 30 mg via INTRAVENOUS
  Filled 2019-05-24 (×4): qty 2

## 2019-05-24 MED ORDER — OXYCODONE HCL 5 MG PO TABS: 5.0000 mg | ORAL_TABLET | Freq: Once | ORAL | Status: DC | PRN

## 2019-05-24 MED ORDER — VANCOMYCIN HCL 1000 MG IV SOLR
INTRAVENOUS | Status: AC
  Filled 2019-05-24: qty 1000

## 2019-05-24 MED ORDER — BUPIVACAINE-EPINEPHRINE (PF) 0.25% -1:200000 IJ SOLN
INTRAMUSCULAR | Status: AC
  Filled 2019-05-24: qty 30

## 2019-05-24 MED ORDER — SORBITOL 70 % SOLN: 30.0000 mL | Freq: Every day | Status: DC | PRN

## 2019-05-24 MED ORDER — METOCLOPRAMIDE HCL 5 MG PO TABS: 5.0000 mg | ORAL_TABLET | Freq: Three times a day (TID) | ORAL | Status: DC | PRN

## 2019-05-24 MED ORDER — VALACYCLOVIR HCL 500 MG PO TABS
1000.0000 mg | ORAL_TABLET | Freq: Every day | ORAL | Status: DC | PRN
  Filled 2019-05-24: qty 2

## 2019-05-24 MED ORDER — PRAVASTATIN SODIUM 40 MG PO TABS
40.0000 mg | ORAL_TABLET | Freq: Every evening | ORAL | Status: DC
  Filled 2019-05-24: qty 1

## 2019-05-24 MED ORDER — DEXAMETHASONE SODIUM PHOSPHATE 10 MG/ML IJ SOLN
INTRAMUSCULAR | Status: AC
  Filled 2019-05-24: qty 1

## 2019-05-24 MED ORDER — SCOPOLAMINE 1 MG/3DAYS TD PT72
MEDICATED_PATCH | TRANSDERMAL | Status: AC
  Filled 2019-05-24: qty 1

## 2019-05-24 MED ORDER — OXYCODONE HCL ER 10 MG PO T12A: 10.0000 mg | EXTENDED_RELEASE_TABLET | Freq: Two times a day (BID) | ORAL | 0 refills | Status: AC

## 2019-05-24 MED ORDER — DOCUSATE SODIUM 100 MG PO CAPS
100.0000 mg | ORAL_CAPSULE | Freq: Two times a day (BID) | ORAL | Status: DC
  Administered 2019-05-24 – 2019-05-25 (×2): 100 mg via ORAL
  Filled 2019-05-24 (×2): qty 1

## 2019-05-24 MED ORDER — FENTANYL CITRATE (PF) 100 MCG/2ML IJ SOLN: 25.0000 ug | INTRAMUSCULAR | Status: DC | PRN

## 2019-05-24 MED ORDER — DEXAMETHASONE SODIUM PHOSPHATE 10 MG/ML IJ SOLN: INTRAMUSCULAR | Status: DC | PRN

## 2019-05-24 MED ORDER — MENTHOL 3 MG MT LOZG: 1.0000 | LOZENGE | OROMUCOSAL | Status: DC | PRN

## 2019-05-24 MED ORDER — FENTANYL CITRATE (PF) 100 MCG/2ML IJ SOLN
INTRAMUSCULAR | Status: AC
  Administered 2019-05-24: 100 ug via INTRAVENOUS
  Filled 2019-05-24: qty 2

## 2019-05-24 MED ORDER — BUPIVACAINE-EPINEPHRINE 0.25% -1:200000 IJ SOLN
INTRAMUSCULAR | Status: DC | PRN
  Administered 2019-05-24: 20 mL

## 2019-05-24 MED ORDER — GABAPENTIN 300 MG PO CAPS
300.0000 mg | ORAL_CAPSULE | Freq: Three times a day (TID) | ORAL | Status: DC
  Administered 2019-05-24 – 2019-05-25 (×2): 300 mg via ORAL
  Filled 2019-05-24 (×2): qty 1

## 2019-05-24 MED ORDER — MIDAZOLAM HCL 2 MG/2ML IJ SOLN
INTRAMUSCULAR | Status: AC
  Administered 2019-05-24: 1 mg via INTRAVENOUS
  Filled 2019-05-24: qty 2

## 2019-05-24 MED ORDER — DEXAMETHASONE SODIUM PHOSPHATE 10 MG/ML IJ SOLN
INTRAMUSCULAR | Status: DC | PRN
  Administered 2019-05-24: 10 mg via INTRAVENOUS

## 2019-05-24 MED ORDER — FENTANYL CITRATE (PF) 250 MCG/5ML IJ SOLN
INTRAMUSCULAR | Status: AC
  Filled 2019-05-24: qty 5

## 2019-05-24 MED ORDER — MIDAZOLAM HCL 2 MG/2ML IJ SOLN
INTRAMUSCULAR | Status: AC
  Filled 2019-05-24: qty 2

## 2019-05-24 MED ORDER — LACTATED RINGERS IV SOLN
INTRAVENOUS | Status: DC
  Administered 2019-05-24 (×2): via INTRAVENOUS

## 2019-05-24 MED ORDER — OXYCODONE HCL 5 MG PO TABS: 10.0000 mg | ORAL_TABLET | ORAL | Status: DC | PRN

## 2019-05-24 MED ORDER — ACETAMINOPHEN 500 MG PO TABS
1000.0000 mg | ORAL_TABLET | Freq: Four times a day (QID) | ORAL | Status: AC
  Administered 2019-05-24 – 2019-05-25 (×4): 1000 mg via ORAL
  Filled 2019-05-24 (×4): qty 2

## 2019-05-24 MED ORDER — DIPHENHYDRAMINE HCL 12.5 MG/5ML PO ELIX: 25.0000 mg | ORAL_SOLUTION | ORAL | Status: DC | PRN

## 2019-05-24 MED ORDER — METHOCARBAMOL 1000 MG/10ML IJ SOLN
500.0000 mg | Freq: Four times a day (QID) | INTRAVENOUS | Status: DC | PRN
  Filled 2019-05-24: qty 5

## 2019-05-24 MED ORDER — FENTANYL CITRATE (PF) 100 MCG/2ML IJ SOLN
100.0000 ug | Freq: Once | INTRAMUSCULAR | Status: AC
  Administered 2019-05-24: 100 ug via INTRAVENOUS

## 2019-05-24 MED ORDER — MIDAZOLAM HCL 2 MG/2ML IJ SOLN
1.0000 mg | Freq: Once | INTRAMUSCULAR | Status: AC
  Administered 2019-05-24: 1 mg via INTRAVENOUS

## 2019-05-24 MED ORDER — METOPROLOL SUCCINATE ER 25 MG PO TB24
25.0000 mg | ORAL_TABLET | Freq: Every evening | ORAL | Status: DC
  Filled 2019-05-24: qty 1

## 2019-05-24 MED ORDER — CELECOXIB 200 MG PO CAPS
200.0000 mg | ORAL_CAPSULE | Freq: Two times a day (BID) | ORAL | Status: DC
  Administered 2019-05-24 – 2019-05-25 (×2): 200 mg via ORAL
  Filled 2019-05-24 (×3): qty 1

## 2019-05-24 MED ORDER — ONDANSETRON HCL 4 MG/2ML IJ SOLN: 4.0000 mg | Freq: Once | INTRAMUSCULAR | Status: DC | PRN

## 2019-05-24 MED ORDER — METHOCARBAMOL 750 MG PO TABS: 750.0000 mg | ORAL_TABLET | Freq: Two times a day (BID) | ORAL | 0 refills | Status: DC | PRN

## 2019-05-24 MED ORDER — VANCOMYCIN HCL 1000 MG IV SOLR
INTRAVENOUS | Status: DC | PRN
  Administered 2019-05-24: 1000 mg

## 2019-05-24 MED ORDER — SODIUM CHLORIDE 0.9% FLUSH
INTRAVENOUS | Status: DC | PRN
  Administered 2019-05-24: 40 mL

## 2019-05-24 MED ORDER — LISINOPRIL 20 MG PO TABS
20.0000 mg | ORAL_TABLET | Freq: Every day | ORAL | Status: DC
  Administered 2019-05-25: 20 mg via ORAL
  Filled 2019-05-24: qty 1

## 2019-05-24 MED ORDER — PROMETHAZINE HCL 25 MG PO TABS: 25.0000 mg | ORAL_TABLET | Freq: Four times a day (QID) | ORAL | 1 refills | Status: DC | PRN

## 2019-05-24 MED ORDER — ASPIRIN EC 81 MG PO TBEC: 81.0000 mg | DELAYED_RELEASE_TABLET | Freq: Two times a day (BID) | ORAL | 0 refills | Status: AC

## 2019-05-24 MED ORDER — TRANEXAMIC ACID-NACL 1000-0.7 MG/100ML-% IV SOLN
1000.0000 mg | Freq: Once | INTRAVENOUS | Status: DC
  Filled 2019-05-24: qty 100

## 2019-05-24 MED ORDER — OXYCODONE HCL 5 MG PO TABS: 5.0000 mg | ORAL_TABLET | ORAL | Status: DC | PRN

## 2019-05-24 MED ORDER — METHOCARBAMOL 500 MG PO TABS: 500.0000 mg | ORAL_TABLET | Freq: Four times a day (QID) | ORAL | Status: DC | PRN

## 2019-05-24 MED ORDER — OXYCODONE HCL ER 10 MG PO T12A
10.0000 mg | EXTENDED_RELEASE_TABLET | Freq: Two times a day (BID) | ORAL | Status: DC
  Administered 2019-05-24 – 2019-05-25 (×2): 10 mg via ORAL
  Filled 2019-05-24 (×2): qty 1

## 2019-05-24 MED ORDER — EZETIMIBE 10 MG PO TABS
10.0000 mg | ORAL_TABLET | Freq: Every day | ORAL | Status: DC
  Administered 2019-05-25: 10 mg via ORAL
  Filled 2019-05-24: qty 1

## 2019-05-24 MED ORDER — POVIDONE-IODINE 10 % EX SWAB
2.0000 "application " | Freq: Once | CUTANEOUS | Status: AC
  Administered 2019-05-24: 2 via TOPICAL

## 2019-05-24 MED ORDER — ONDANSETRON HCL 4 MG/2ML IJ SOLN
INTRAMUSCULAR | Status: AC
  Filled 2019-05-24: qty 4

## 2019-05-24 MED ORDER — CEFAZOLIN SODIUM-DEXTROSE 2-4 GM/100ML-% IV SOLN
2.0000 g | Freq: Four times a day (QID) | INTRAVENOUS | Status: AC
  Administered 2019-05-25 (×2): 2 g via INTRAVENOUS
  Filled 2019-05-24 (×3): qty 100

## 2019-05-24 MED ORDER — FENTANYL CITRATE (PF) 100 MCG/2ML IJ SOLN
INTRAMUSCULAR | Status: DC | PRN
  Administered 2019-05-24: 50 ug via INTRAVENOUS

## 2019-05-24 MED ORDER — SODIUM CHLORIDE 0.9 % IR SOLN
Status: DC | PRN
  Administered 2019-05-24: 3000 mL

## 2019-05-24 MED ORDER — ALUM & MAG HYDROXIDE-SIMETH 200-200-20 MG/5ML PO SUSP: 30.0000 mL | ORAL | Status: DC | PRN

## 2019-05-24 MED ORDER — 0.9 % SODIUM CHLORIDE (POUR BTL) OPTIME
TOPICAL | Status: DC | PRN
  Administered 2019-05-24: 13:00:00 1000 mL

## 2019-05-24 MED ORDER — OXYCODONE HCL 5 MG/5ML PO SOLN: 5.0000 mg | Freq: Once | ORAL | Status: DC | PRN

## 2019-05-24 MED ORDER — SODIUM CHLORIDE 0.9 % IV SOLN
INTRAVENOUS | Status: DC
  Administered 2019-05-25: via INTRAVENOUS

## 2019-05-24 MED ORDER — ONDANSETRON HCL 4 MG PO TABS: 4.0000 mg | ORAL_TABLET | Freq: Three times a day (TID) | ORAL | 0 refills | Status: DC | PRN

## 2019-05-24 MED ORDER — PHENOL 1.4 % MT LIQD: 1.0000 | OROMUCOSAL | Status: DC | PRN

## 2019-05-24 MED ORDER — PROPOFOL 10 MG/ML IV BOLUS
INTRAVENOUS | Status: AC
  Filled 2019-05-24: qty 20

## 2019-05-24 MED ORDER — HYDROMORPHONE HCL 1 MG/ML IJ SOLN: 0.5000 mg | INTRAMUSCULAR | Status: DC | PRN

## 2019-05-24 MED ORDER — DEXAMETHASONE SODIUM PHOSPHATE 10 MG/ML IJ SOLN
10.0000 mg | Freq: Once | INTRAMUSCULAR | Status: AC
  Administered 2019-05-25: 10 mg via INTRAVENOUS
  Filled 2019-05-24: qty 1

## 2019-05-24 MED ORDER — MIDAZOLAM HCL 5 MG/5ML IJ SOLN
INTRAMUSCULAR | Status: DC | PRN
  Administered 2019-05-24: 2 mg via INTRAVENOUS

## 2019-05-24 MED ORDER — POLYETHYLENE GLYCOL 3350 17 G PO PACK: 17.0000 g | PACK | Freq: Every day | ORAL | Status: DC | PRN

## 2019-05-24 MED ORDER — ASPIRIN 81 MG PO CHEW
81.0000 mg | CHEWABLE_TABLET | Freq: Two times a day (BID) | ORAL | Status: DC
  Administered 2019-05-24 – 2019-05-25 (×2): 81 mg via ORAL
  Filled 2019-05-24 (×2): qty 1

## 2019-05-24 SURGICAL SUPPLY — 79 items
ALCOHOL ISOPROPYL (RUBBING) (MISCELLANEOUS) ×3 IMPLANT
BAG DECANTER FOR FLEXI CONT (MISCELLANEOUS) ×3 IMPLANT
BANDAGE ESMARK 6X9 LF (GAUZE/BANDAGES/DRESSINGS) ×1 IMPLANT
BLADE SAW SGTL 13.0X1.19X90.0M (BLADE) ×3 IMPLANT
BLADE SURG 10 STRL SS (BLADE) ×6 IMPLANT
BNDG ELASTIC 6X10 VLCR STRL LF (GAUZE/BANDAGES/DRESSINGS) ×3 IMPLANT
BNDG ESMARK 6X9 LF (GAUZE/BANDAGES/DRESSINGS) ×3
BOWL SMART MIX CTS (DISPOSABLE) ×3 IMPLANT
CEMENT BONE R 1X40 (Cement) ×4 IMPLANT
CLOSURE STERI-STRIP 1/2X4 (GAUZE/BANDAGES/DRESSINGS) ×2
CLSR STERI-STRIP ANTIMIC 1/2X4 (GAUZE/BANDAGES/DRESSINGS) ×4 IMPLANT
COMP FEM CMT PERS SZ 11 LT (Joint) ×3 IMPLANT
COMPONENT FEM CMT PERS SZ11LT (Joint) IMPLANT
CONT SPEC 4OZ CLIKSEAL STRL BL (MISCELLANEOUS) ×2 IMPLANT
COVER SURGICAL LIGHT HANDLE (MISCELLANEOUS) ×3 IMPLANT
COVER WAND RF STERILE (DRAPES) ×3 IMPLANT
CUFF TOURNIQUET SINGLE 34IN LL (TOURNIQUET CUFF) ×3 IMPLANT
CUFF TOURNIQUET SINGLE 44IN (TOURNIQUET CUFF) IMPLANT
DRAPE EXTREMITY T 121X128X90 (DISPOSABLE) ×3 IMPLANT
DRAPE HALF SHEET 40X57 (DRAPES) ×3 IMPLANT
DRAPE INCISE IOBAN 66X45 STRL (DRAPES) IMPLANT
DRAPE ORTHO SPLIT 77X108 STRL (DRAPES) ×4
DRAPE POUCH INSTRU U-SHP 10X18 (DRAPES) ×3 IMPLANT
DRAPE SURG ORHT 6 SPLT 77X108 (DRAPES) ×2 IMPLANT
DRAPE U-SHAPE 47X51 STRL (DRAPES) ×6 IMPLANT
DURAPREP 26ML APPLICATOR (WOUND CARE) ×8 IMPLANT
ELECT BLADE 4.0 EZ CLEAN MEGAD (MISCELLANEOUS) ×3
ELECT CAUTERY BLADE 6.4 (BLADE) ×3 IMPLANT
ELECT REM PT RETURN 9FT ADLT (ELECTROSURGICAL) ×3
ELECTRODE BLDE 4.0 EZ CLN MEGD (MISCELLANEOUS) IMPLANT
ELECTRODE REM PT RTRN 9FT ADLT (ELECTROSURGICAL) ×1 IMPLANT
GAUZE SPONGE 4X4 12PLY STRL LF (GAUZE/BANDAGES/DRESSINGS) ×3 IMPLANT
GLOVE BIOGEL PI IND STRL 7.0 (GLOVE) ×1 IMPLANT
GLOVE BIOGEL PI INDICATOR 7.0 (GLOVE) ×2
GLOVE ECLIPSE 7.0 STRL STRAW (GLOVE) ×9 IMPLANT
GLOVE SKINSENSE NS SZ7.5 (GLOVE) ×2
GLOVE SKINSENSE STRL SZ7.5 (GLOVE) ×1 IMPLANT
GLOVE SURG SYN 7.5  E (GLOVE) ×8
GLOVE SURG SYN 7.5 E (GLOVE) ×4 IMPLANT
GLOVE SURG SYN 7.5 PF PI (GLOVE) ×4 IMPLANT
GOWN STRL REIN XL XLG (GOWN DISPOSABLE) ×3 IMPLANT
GOWN STRL REUS W/ TWL LRG LVL3 (GOWN DISPOSABLE) ×1 IMPLANT
GOWN STRL REUS W/TWL LRG LVL3 (GOWN DISPOSABLE) ×2
HANDPIECE INTERPULSE COAX TIP (DISPOSABLE) ×2
HOOD PEEL AWAY FLYTE STAYCOOL (MISCELLANEOUS) ×6 IMPLANT
INSERT ARTISURF S8-11 18X22X14 (Insert) ×2 IMPLANT
KIT BASIN OR (CUSTOM PROCEDURE TRAY) ×3 IMPLANT
KIT TURNOVER KIT B (KITS) ×3 IMPLANT
MANIFOLD NEPTUNE II (INSTRUMENTS) ×3 IMPLANT
MARKER SKIN DUAL TIP RULER LAB (MISCELLANEOUS) ×3 IMPLANT
NDL SPNL 18GX3.5 QUINCKE PK (NEEDLE) ×2 IMPLANT
NEEDLE SPNL 18GX3.5 QUINCKE PK (NEEDLE) ×9 IMPLANT
NS IRRIG 1000ML POUR BTL (IV SOLUTION) ×3 IMPLANT
PACK TOTAL JOINT (CUSTOM PROCEDURE TRAY) ×3 IMPLANT
PAD ABD 8X10 STRL (GAUZE/BANDAGES/DRESSINGS) ×6 IMPLANT
PAD ARMBOARD 7.5X6 YLW CONV (MISCELLANEOUS) ×6 IMPLANT
PADDING CAST COTTON 6X4 STRL (CAST SUPPLIES) ×3 IMPLANT
SAW OSC TIP CART 19.5X105X1.3 (SAW) ×3 IMPLANT
SET HNDPC FAN SPRY TIP SCT (DISPOSABLE) ×1 IMPLANT
STAPLER VISISTAT 35W (STAPLE) IMPLANT
STEM POLY PAT PLY 35M KNEE (Knees) ×2 IMPLANT
STEM TIBIA 5 DEG SZ H L KNEE (Knees) IMPLANT
SUCTION FRAZIER HANDLE 10FR (MISCELLANEOUS) ×4
SUCTION TUBE FRAZIER 10FR DISP (MISCELLANEOUS) ×1 IMPLANT
SUT ETHILON 2 0 FS 18 (SUTURE) IMPLANT
SUT MNCRL AB 4-0 PS2 18 (SUTURE) ×2 IMPLANT
SUT VIC AB 0 CT1 27 (SUTURE) ×4
SUT VIC AB 0 CT1 27XBRD ANBCTR (SUTURE) ×2 IMPLANT
SUT VIC AB 1 CTX 27 (SUTURE) ×11 IMPLANT
SUT VIC AB 2-0 CT1 27 (SUTURE) ×8
SUT VIC AB 2-0 CT1 TAPERPNT 27 (SUTURE) ×4 IMPLANT
SYR 50ML LL SCALE MARK (SYRINGE) ×6 IMPLANT
TIBIA STEM 5 DEG SZ H L KNEE (Knees) ×3 IMPLANT
TOWEL OR 17X24 6PK STRL BLUE (TOWEL DISPOSABLE) ×3 IMPLANT
TOWEL OR 17X26 10 PK STRL BLUE (TOWEL DISPOSABLE) ×3 IMPLANT
TRAY CATH 16FR W/PLASTIC CATH (SET/KITS/TRAYS/PACK) IMPLANT
UNDERPAD 30X30 (UNDERPADS AND DIAPERS) ×3 IMPLANT
WRAP KNEE MAXI GEL POST OP (GAUZE/BANDAGES/DRESSINGS) ×3 IMPLANT
YANKAUER SUCT BULB TIP NO VENT (SUCTIONS) ×2 IMPLANT

## 2019-05-24 NOTE — Care Plan (Signed)
Ortho Bundle Case Management Note  Patient Details  Name: Nathan Jones MRN: [id number] Date of Birth: 12/31/1950  [facility] spoke with patient for pre-op discussion regarding Ortho Bundle Case management needs. Patient has family that will assist once discharged. Anticipate d/c to home with HHPT. Choice provided. All DME arranged to be delivered to hospital prior to d/c home. Will continue to follow for Ortho Bundle CM needs.    DME Arranged:  3-N-1, Bedside commode, Walker rolling DME Agency:  [employer]  HH Arranged:  PT [place] Agency:  [facility]  Additional Comments: Please contact me with any questions of if this plan should need to change.  Jamse Arn, RN, BSN, [employer]  [phone] 05/24/2019, 2:14 PM

## 2019-05-24 NOTE — Anesthesia Procedure Notes (Signed)
Anesthesia Regional Block: Adductor canal block   Pre-Anesthetic Checklist: ,, timeout performed, Correct Patient, Correct Site, Correct Laterality, Correct Procedure, Correct Position, site marked, Risks and benefits discussed, pre-op evaluation,  At surgeon's request and post-op pain management  Laterality: Left  Prep: Maximum Sterile Barrier Precautions used, chloraprep       Needles:  Injection technique: Single-shot  Needle Type: Echogenic Stimulator Needle     Needle Length: 9cm  Needle Gauge: 21     Additional Needles:   Procedures:,,,, ultrasound used (permanent image in chart),,,,  Narrative:  Start time: 05/24/2019 12:10 PM End time: 05/24/2019 12:15 PM Injection made incrementally with aspirations every 5 mL.  Performed by: Personally  Anesthesiologist: Roberts Gaudy, MD  Additional Notes: 20 cc 0.75% Ropivacaine injected easily

## 2019-05-24 NOTE — Evaluation (Signed)
Physical Therapy Evaluation Patient Details Name: Nathan Jones MRN: [id number] DOB: [birth year] Today's Date: 05/24/2019   History of Present Illness  Pt is a 68 y/o male s/p elective L TKA. PMH includes AAA, CAD, HTN, sleep apnea on CPAP, smoker.   Clinical Impression  Pt is s/p surgery above with deficits below. Pt requiring min guard A for mobility tasks using RW. Reviewed knee precautions and supine HEP. Will continue to follow acutely to maximize functional mobility independence and safety.     Follow Up Recommendations Follow surgeon's recommendation for DC plan and follow-up therapies;Supervision for mobility/OOB    Equipment Recommendations  Rolling walker with 5" wheels;3in1 (PT)    Recommendations for Other Services       Precautions / Restrictions Precautions Precautions: Knee Precaution Booklet Issued: No Precaution Comments: Verbally reviewed knee precautions  Restrictions Weight Bearing Restrictions: Yes LLE Weight Bearing: Weight bearing as tolerated      Mobility  Bed Mobility Overal bed mobility: Needs Assistance Bed Mobility: Supine to Sit     Supine to sit: Supervision     General bed mobility comments: Supervision for safety.   Transfers Overall transfer level: Needs assistance Equipment used: Rolling walker (2 wheeled) Transfers: Sit to/from Stand Sit to Stand: Min guard         General transfer comment: Min guard A for steadying assist. Cues for safe hand placement.   Ambulation/Gait Ambulation/Gait assistance: Min guard Gait Distance (Feet): 5 Feet Assistive device: Rolling walker (2 wheeled) Gait Pattern/deviations: Step-to pattern;Decreased step length - right;Decreased step length - left;Decreased weight shift to left;Antalgic Gait velocity: Decreased    General Gait Details: Slow, antalgic gait. Cues for sequencing using RW. Distance limited to chair, as pt's food tray arrived and he wanted to eat.   Stairs            Wheelchair  Mobility    Modified Rankin (Stroke Patients Only)       Balance Overall balance assessment: Needs assistance Sitting-balance support: No upper extremity supported;Feet supported Sitting balance-Leahy Scale: Good     Standing balance support: Bilateral upper extremity supported;During functional activity Standing balance-Leahy Scale: Poor Standing balance comment: Reliant on BUE support                              Pertinent Vitals/Pain Pain Assessment: Faces Faces Pain Scale: Hurts a little bit Pain Location: L knee  Pain Descriptors / Indicators: Aching;Operative site guarding Pain Intervention(s): Limited activity within patient's tolerance;Monitored during session;Repositioned    Home Living Family/patient expects to be discharged to:: Private residence Living Arrangements: Spouse/significant other Available Help at Discharge: Family;Available 24 hours/day Type of Home: House Home Access: Stairs to enter Entrance Stairs-Rails: Right Entrance Stairs-Number of Steps: 3 Home Layout: One level Home Equipment: None      Prior Function Level of Independence: Independent               Hand Dominance        Extremity/Trunk Assessment   Upper Extremity Assessment Upper Extremity Assessment: Overall WFL for tasks assessed    Lower Extremity Assessment Lower Extremity Assessment: LLE deficits/detail LLE Deficits / Details: Deficits consistent with post op pain and weakness. Able to perform ther ex below.     Cervical / Trunk Assessment Cervical / Trunk Assessment: Normal  Communication   Communication: No difficulties  Cognition Arousal/Alertness: Awake/alert Behavior During Therapy: WFL for tasks assessed/performed Overall Cognitive Status: Within Functional Limits  for tasks assessed                                        General Comments      Exercises Total Joint Exercises Ankle Circles/Pumps: AROM;Both;20  reps;Supine Quad Sets: AROM;Left;10 reps;Supine   Assessment/Plan    PT Assessment Patient needs continued PT services  PT Problem List Decreased strength;Decreased balance;Decreased mobility;Decreased knowledge of use of DME;Decreased knowledge of precautions;Decreased range of motion;Pain       PT Treatment Interventions DME instruction;Gait training;Functional mobility training;Stair training;Therapeutic activities;Therapeutic exercise;Balance training;Patient/family education    PT Goals (Current goals can be found in the Care Plan section)  Acute Rehab PT Goals Patient Stated Goal: to go home PT Goal Formulation: With patient Time For Goal Achievement: 06/07/19 Potential to Achieve Goals: Good    Frequency 7X/week   Barriers to discharge        Co-evaluation               AM-PAC PT "6 Clicks" Mobility  Outcome Measure Help needed turning from your back to your side while in a flat bed without using bedrails?: None Help needed moving from lying on your back to sitting on the side of a flat bed without using bedrails?: A Little Help needed moving to and from a bed to a chair (including a wheelchair)?: A Little Help needed standing up from a chair using your arms (e.g., wheelchair or bedside chair)?: A Little Help needed to walk in hospital room?: A Little Help needed climbing 3-5 steps with a railing? : A Little 6 Click Score: 19    End of Session Equipment Utilized During Treatment: Gait belt Activity Tolerance: Patient tolerated treatment well Patient left: in chair;with call bell/phone within reach Nurse Communication: Mobility status PT Visit Diagnosis: Other abnormalities of gait and mobility (R26.89);Pain Pain - Right/Left: Left Pain - part of body: Knee    Time: 8546-2703 PT Time Calculation (min) (ACUTE ONLY): 20 min   Charges:   PT Evaluation $PT Eval Low Complexity: Neuse Forest, PT, DPT  Acute Rehabilitation Services   Pager: [phone] Office: [phone]   [name] 05/24/2019, 6:14 PM

## 2019-05-24 NOTE — H&P (Signed)
PREOPERATIVE H&P  Chief Complaint: left knee degenerative joint disease  HPI: Nathan Jones is a 68 y.o. male who presents for surgical treatment of left knee degenerative joint disease.  He denies any changes in medical history.  Past Medical History:  Diagnosis Date   AAA (abdominal aortic aneurysm) (HCC)    3.4 cm 12/2018   Arthritis    CAD (coronary artery disease)    stent to LAD 2016   Depression    Hyperlipidemia    Hypertension    MMT (medial meniscus tear)    left knee   Myocardial infarction ([place])    in 2016; stent to LAD.    Skin cancer    Sleep apnea    uses CPAP nightly   Smoker    1ppd   Past Surgical History:  Procedure Laterality Date   ACHILLES TENDON SURGERY Left 2018   Dr. Berenice Primas   APPENDECTOMY  1988   CARDIAC CATHETERIZATION  06/01/2015   Xience DES to LAD (3.2mmx15mm) - [place]  2010   CHOLECYSTECTOMY  1988   CHONDROPLASTY Left 01/16/2019   Procedure: CHONDROPLASTY AND REMOVAL OF LOOSE BODY;  Surgeon: Leandrew Koyanagi, MD;  Location: [place];  Service: Orthopedics;  Laterality: Left;   KNEE ARTHROSCOPY WITH MEDIAL MENISECTOMY Left 01/16/2019   Procedure: LEFT KNEE ARTHROSCOPY WITH PARTIAL MEDIAL MENISCECTOMY;  Surgeon: Leandrew Koyanagi, MD;  Location: [place];  Service: Orthopedics;  Laterality: Left;   Social History   Socioeconomic History   Marital status: Married    Spouse name: Not on file   Number of children: 4   Years of education: 12   Highest education level: Not on file  Occupational History   Occupation: Primary school teacher    Comment: retired  Scientist, product/process development strain: Not on file   Food insecurity:    Worry: Not on file    Inability: Not on Lexicographer needs:    Medical: Not on file    Non-medical: Not on file  Tobacco Use   Smoking status: Current Every Day Smoker    Packs/day: 1.00    Years: 45.00    Pack  years: 45.00    Types: Cigarettes   Smokeless tobacco: Never Used  Substance and Sexual Activity   Alcohol use: Yes    Alcohol/week: 0.0 standard drinks    Comment: social    Drug use: No   Sexual activity: Yes  Lifestyle   Physical activity:    Days per week: Not on file    Minutes per session: Not on file   Stress: Not on file  Relationships   Social connections:    Talks on phone: Not on file    Gets together: Not on file    Attends religious service: Not on file    Active member of club or organization: Not on file    Attends meetings of clubs or organizations: Not on file    Relationship status: Not on file  Other Topics Concern   Not on file  Social History Narrative   Epworth Sleepiness Scale = 10 (09/09/2015)   Family History  Problem Relation Age of Onset   Heart disease Father        pacemaker   Heart disease Maternal Grandfather    Stroke Maternal Grandmother    Allergies  Allergen Reactions   Atorvastatin Other (See Comments)    Gas, stomach pains  Prior to Admission medications   Medication Sig Start Date End Date Taking? Authorizing Provider  aspirin EC 81 MG tablet Take 81 mg by mouth daily.   Yes [provider]  ezetimibe (ZETIA) 10 MG tablet TAKE ONE TABLET BY MOUTH DAILY 03/21/19  Yes Lendon Colonel, NP  ibuprofen (ADVIL) 200 MG tablet Take 400 mg by mouth every 6 (six) hours as needed (pain).    Yes [provider]  Ibuprofen-diphenhydrAMINE HCl (ADVIL PM) 200-25 MG CAPS Take 2 tablets by mouth at bedtime as needed (pain/sleep).   Yes [provider]  lisinopril (PRINIVIL,ZESTRIL) 20 MG tablet Take 1 tablet (20 mg total) by mouth daily. 01/15/19  Yes Lendon Colonel, NP  metoprolol succinate (TOPROL-XL) 25 MG 24 hr tablet Take 25 mg by mouth every evening.    Yes [provider]  pravastatin (PRAVACHOL) 40 MG tablet Take 1 tablet (40 mg total) by mouth every evening. 03/22/19  Yes Hilty, Nadean Corwin,  MD  sertraline (ZOLOFT) 50 MG tablet Take 50 mg by mouth every evening.    Yes [provider]  ValACYclovir HCl (VALTREX PO) Take 1,000 mg by mouth daily as needed (cold sores).    Yes [provider]     Positive ROS: All other systems have been reviewed and were otherwise negative with the exception of those mentioned in the HPI and as above.  Physical Exam: General: Alert, no acute distress Cardiovascular: No pedal edema Respiratory: No cyanosis, no use of accessory musculature GI: abdomen soft Skin: No lesions in the area of chief complaint Neurologic: Sensation intact distally Psychiatric: Patient is competent for consent with normal mood and affect Lymphatic: no lymphedema  MUSCULOSKELETAL: exam stable  Assessment: left knee degenerative joint disease  Plan: Plan for Procedure(s): LEFT TOTAL KNEE ARTHROPLASTY  The risks benefits and alternatives were discussed with the patient including but not limited to the risks of nonoperative treatment, versus surgical intervention including infection, bleeding, nerve injury,  blood clots, cardiopulmonary complications, morbidity, mortality, among others, and they were willing to proceed.   Preoperative templating of the joint replacement has been completed, documented, and submitted to the Operating Room personnel in order to optimize intra-operative equipment management.  Patient's anticipated LOS is less than 2 midnights, meeting these requirements: - Younger than 35 - Lives within 1 hour of care - Has a competent adult at home to recover with post-op recover - NO history of  - Chronic pain requiring opiods  - Diabetes  - Coronary Artery Disease  - Heart failure  - Heart attack  - Stroke  - DVT/VTE  - Cardiac arrhythmia  - Respiratory Failure/COPD  - Renal failure  - Anemia  - Advanced Liver disease  Eduard Roux, MD   05/24/2019 8:11 AM

## 2019-05-24 NOTE — Transfer of Care (Signed)
Immediate Anesthesia Transfer of Care Note  Patient: Nathan Jones  Procedure(s) Performed: LEFT TOTAL KNEE ARTHROPLASTY (Left Knee)  Patient Location: PACU  Anesthesia Type:Spinal and MAC combined with regional for post-op pain  Level of Consciousness: awake  Airway & Oxygen Therapy: Patient Spontanous Breathing  Post-op Assessment: Report given to RN and Post -op Vital signs reviewed and stable  Post vital signs: Reviewed and stable  Last Vitals:  Vitals Value Taken Time  BP 103/53 05/24/2019  3:13 PM  Temp    Pulse 61 05/24/2019  3:14 PM  Resp 14 05/24/2019  3:14 PM  SpO2 96 % 05/24/2019  3:14 PM  Vitals shown include unvalidated device data.  Last Pain:  Vitals:   05/24/19 1510  PainSc: (P) 0-No pain      Patients Stated Pain Goal: 3 (27/61/47 0929)  Complications: No apparent anesthesia complications

## 2019-05-24 NOTE — Op Note (Signed)
Total Knee Arthroplasty Procedure Note  Preoperative diagnosis: Left knee osteoarthritis  Postoperative diagnosis:same  Operative procedure: Left total knee arthroplasty. CPT [phone]  Surgeon: N. Eduard Roux, MD  Assist: Madalyn Rob, PA-C; necessary for the timely completion of procedure and due to complexity of procedure.  Anesthesia: Spinal, regional  Tourniquet time: see anesthesia record  Implants used: Zimmer persona Femur: CR 11 Tibia: H Patella: 35 mm Polyethylene: 10 mm, MC  Indication: Nathan Jones is a 68 y.o. year old male with a history of knee pain. Having failed conservative management, the patient elected to proceed with a total knee arthroplasty.  We have reviewed the risk and benefits of the surgery and they elected to proceed after voicing understanding.  Procedure:  After informed consent was obtained and understanding of the risk were voiced including but not limited to bleeding, infection, damage to surrounding structures including nerves and vessels, blood clots, leg length inequality and the failure to achieve desired results, the operative extremity was marked with verbal confirmation of the patient in the holding area.   The patient was then brought to the operating room and transported to the operating room table in the supine position.  A tourniquet was applied to the operative extremity around the upper thigh. The operative limb was then prepped and draped in the usual sterile fashion and preoperative antibiotics were administered.  A time out was performed prior to the start of surgery confirming the correct extremity, preoperative antibiotic administration, as well as team members, implants and instruments available for the case. Correct surgical site was also confirmed with preoperative radiographs. The limb was then elevated for exsanguination and the tourniquet was inflated. A midline incision was made and a standard medial parapatellar approach  was performed.  The patella was prepared and sized to a 35 mm.  A cover was placed on the patella for protection from retractors.  We then turned our attention to the femur. Posterior cruciate ligament was sacrificed. Start site was drilled in the femur and the intramedullary distal femoral cutting guide was placed, set at 5 degrees valgus, taking 10 mm of distal resection. The distal cut was made. Osteophytes were then removed. Next, the proximal tibial cutting guide was placed with appropriate slope, varus/valgus alignment and depth of resection. The proximal tibial cut was made. Gap blocks were then used to assess the extension gap and alignment, and appropriate soft tissue releases were performed. Attention was turned back to the femur, which was sized using the sizing guide to a size 11. Appropriate rotation of the femoral component was determined using epicondylar axis, Whiteside's line, and assessing the flexion gap under ligament tension. The appropriate size 4-in-1 cutting block was placed and cuts were made. Posterior femoral osteophytes and uncapped bone were then removed with the curved osteotome.  Trial components were placed, and stability was checked in full extension, mid-flexion, and deep flexion. Proper tibial rotation was determined and marked.  The tibia was sized for a size H component and the tibia surface was prepared.  The patella tracked well without a lateral release. Trial components were then removed and tibial preparation performed. A posterior capsular injection comprising of 20 cc of 1.3% exparel, 20 cc of 0.25% bupivicaine with epi and 20 cc of normal saline was performed for postoperative pain control. The bony surfaces were irrigated with a pulse lavage and then dried. Bone cement was vacuum mixed on the back table, and the final components sized above were cemented into place. After  cement had finished curing, excess cement was removed. The stability of the construct was  re-evaluated throughout a range of motion and found to be acceptable. The trial liner was removed, the knee was copiously irrigated, and the knee was re-evaluated for any excess bone debris. The real polyethylene liner, 10 mm thick, was inserted and checked to ensure the locking mechanism had engaged appropriately. The tourniquet was deflated and hemostasis was achieved. The wound was irrigated with normal saline.  One gram of vancomycin powder was placed in the surgical bed. A drain was not placed. Capsular closure was performed with a #1 vicryl, subcutaneous fat closed with a 0 vicryl suture, then subcutaneous tissue closed with interrupted 2.0 vicryl suture. The skin was then closed with a 4.0 monocryl. A sterile dressing was applied.  The patient was awakened in the operating room and taken to recovery in stable condition. All sponge, needle, and instrument counts were correct at the end of the case.  Position: supine  Complications: none.  Time Out: performed   Drains/Packing: none  Estimated blood loss: minimal  Returned to Recovery Room: in good condition.   Antibiotics: yes   Mechanical VTE (DVT) Prophylaxis: sequential compression devices, TED thigh-high  Chemical VTE (DVT) Prophylaxis: aspirin  Fluid Replacement  Crystalloid: see anesthesia record Blood: none  FFP: none   Specimens Removed: 1 to pathology   Sponge and Instrument Count Correct? yes   PACU: portable radiograph - knee AP and Lateral   Plan/RTC: Return in 2 weeks for wound check.   Weight Bearing/Load Lower Extremity: full   N. Eduard Roux, MD [place] 2:23 PM

## 2019-05-24 NOTE — Discharge Instructions (Signed)

## 2019-05-24 NOTE — Progress Notes (Signed)
Orthopedic Tech Progress Note Patient Details:  Nathan Jones 1951-01-08 [id number] PACU RN called requesting CPM and they needed to xray the patient so the RN said they'll put it on the patient. CPM Left Knee CPM Left Knee: Other (Comment) Left Knee Flexion (Degrees): 90 Left Knee Extension (Degrees): 0  Post Interventions Patient Tolerated: Other (comment) Instructions Provided: Other (comment)  Janit Pagan 05/24/2019, 4:29 PM

## 2019-05-24 NOTE — Anesthesia Procedure Notes (Signed)
Spinal  Patient location during procedure: OR Start time: 05/24/2019 12:40 PM End time: 05/24/2019 12:45 PM Staffing Anesthesiologist: Roberts Gaudy, MD Performed: anesthesiologist  Preanesthetic Checklist Completed: patient identified, site marked, surgical consent, pre-op evaluation, timeout performed, IV checked, risks and benefits discussed and monitors and equipment checked Spinal Block Patient position: sitting Prep: DuraPrep Patient monitoring: heart rate, cardiac monitor, continuous pulse ox and blood pressure Approach: midline Location: L3-4 Injection technique: single-shot Needle Needle type: Pencan  Needle gauge: 24 G Needle length: 9 cm Assessment Sensory level: T6 Additional Notes 14 mg 0.75% bupivacaine injected easily

## 2019-05-25 DIAGNOSIS — I2510 Atherosclerotic heart disease of native coronary artery without angina pectoris: Secondary | ICD-10-CM | POA: Diagnosis not present

## 2019-05-25 DIAGNOSIS — F17210 Nicotine dependence, cigarettes, uncomplicated: Secondary | ICD-10-CM | POA: Diagnosis not present

## 2019-05-25 DIAGNOSIS — M1712 Unilateral primary osteoarthritis, left knee: Secondary | ICD-10-CM | POA: Diagnosis not present

## 2019-05-25 DIAGNOSIS — M25762 Osteophyte, left knee: Secondary | ICD-10-CM | POA: Diagnosis not present

## 2019-05-25 DIAGNOSIS — Z955 Presence of coronary angioplasty implant and graft: Secondary | ICD-10-CM | POA: Diagnosis not present

## 2019-05-25 DIAGNOSIS — I714 Abdominal aortic aneurysm, without rupture: Secondary | ICD-10-CM | POA: Diagnosis not present

## 2019-05-25 LAB — CBC
HCT: 47.4 % (ref 39.0–52.0)
Hemoglobin: 16.1 g/dL (ref 13.0–17.0)
MCH: 29.6 pg (ref 26.0–34.0)
MCHC: 34 g/dL (ref 30.0–36.0)
MCV: 87.1 fL (ref 80.0–100.0)
Platelets: 174 10*3/uL (ref 150–400)
RBC: 5.44 MIL/uL (ref 4.22–5.81)
RDW: 12.5 % (ref 11.5–15.5)
WBC: 17.2 10*3/uL — ABNORMAL HIGH (ref 4.0–10.5)
nRBC: 0 % (ref 0.0–0.2)

## 2019-05-25 LAB — BASIC METABOLIC PANEL
Anion gap: 9 (ref 5–15)
BUN: 26 mg/dL — ABNORMAL HIGH (ref 8–23)
CO2: 23 mmol/L (ref 22–32)
Calcium: 9.1 mg/dL (ref 8.9–10.3)
Chloride: 104 mmol/L (ref 98–111)
Creatinine, Ser: 1.33 mg/dL — ABNORMAL HIGH (ref 0.61–1.24)
GFR calc Af Amer: >60 mL/min (ref >60)
GFR calc non Af Amer: 55 mL/min — ABNORMAL LOW (ref >60)
Glucose, Bld: 131 mg/dL — ABNORMAL HIGH (ref 70–99)
Potassium: 4.7 mmol/L (ref 3.5–5.1)
Sodium: 136 mmol/L (ref 135–145)

## 2019-05-25 NOTE — Progress Notes (Signed)
Physical Therapy Treatment Patient Details Name: Nathan Jones MRN: [id number] DOB: 10-19-1951 Today's Date: 05/25/2019    History of Present Illness Pt is a 68 y/o male s/p elective L TKA. PMH includes AAA, CAD, HTN, sleep apnea on CPAP, smoker.     PT Comments    Pt progressing well towards goals and able to tolerate increased gait distance. Pt requiring min guard A for mobility using RW. Reviewed supine HEP. Plan to return for second session to practice stair navigation. Current recommendations appropriate and pt eager to return home today. Will continue to follow acutely to maximize functional mobility independence and safety.     Follow Up Recommendations  Follow surgeon's recommendation for DC plan and follow-up therapies;Supervision for mobility/OOB     Equipment Recommendations  Rolling walker with 5" wheels;3in1 (PT)    Recommendations for Other Services       Precautions / Restrictions Precautions Precautions: Knee Precaution Booklet Issued: Yes (comment) Precaution Comments: REviewed knee precautions and supine HEP.  Restrictions Weight Bearing Restrictions: Yes LLE Weight Bearing: Weight bearing as tolerated    Mobility  Bed Mobility Overal bed mobility: Modified Independent                Transfers Overall transfer level: Needs assistance Equipment used: Rolling walker (2 wheeled) Transfers: Sit to/from Stand Sit to Stand: Min guard         General transfer comment: Min guard for safety. Cues for safe hand placement.   Ambulation/Gait Ambulation/Gait assistance: Min guard Gait Distance (Feet): 150 Feet Assistive device: Rolling walker (2 wheeled) Gait Pattern/deviations: Step-through pattern;Decreased step length - right;Decreased step length - left;Decreased weight shift to left;Antalgic Gait velocity: Decreased    General Gait Details: Slow, slightly antalgic gait. Able to progress to step through pattern this session. Cues for knee extension in  LLE during stance phase.    Stairs             Wheelchair Mobility    Modified Rankin (Stroke Patients Only)       Balance Overall balance assessment: Needs assistance Sitting-balance support: No upper extremity supported;Feet supported Sitting balance-Leahy Scale: Good     Standing balance support: Bilateral upper extremity supported;During functional activity Standing balance-Leahy Scale: Poor Standing balance comment: Reliant on BUE support                             Cognition Arousal/Alertness: Awake/alert Behavior During Therapy: WFL for tasks assessed/performed Overall Cognitive Status: Within Functional Limits for tasks assessed                                        Exercises Total Joint Exercises Ankle Circles/Pumps: AROM;Both;20 reps;Supine Quad Sets: AROM;Left;10 reps;Supine Short Arc Quad: AROM;Left;10 reps;Supine Heel Slides: AROM;Left;10 reps;Supine Hip ABduction/ADduction: AROM;Left;10 reps;Supine Straight Leg Raises: AROM;Left;10 reps;Supine    General Comments        Pertinent Vitals/Pain Pain Assessment: 0-10 Pain Score: 2  Pain Location: L knee  Pain Descriptors / Indicators: Aching;Operative site guarding Pain Intervention(s): Limited activity within patient's tolerance;Monitored during session;Repositioned    Home Living                      Prior Function            PT Goals (current goals can now be found in the care plan  section) Acute Rehab PT Goals Patient Stated Goal: to go home PT Goal Formulation: With patient Time For Goal Achievement: 06/07/19 Potential to Achieve Goals: Good Progress towards PT goals: Progressing toward goals    Frequency    7X/week      PT Plan Current plan remains appropriate    Co-evaluation              AM-PAC PT "6 Clicks" Mobility   Outcome Measure  Help needed turning from your back to your side while in a flat bed without using  bedrails?: None Help needed moving from lying on your back to sitting on the side of a flat bed without using bedrails?: None Help needed moving to and from a bed to a chair (including a wheelchair)?: A Little Help needed standing up from a chair using your arms (e.g., wheelchair or bedside chair)?: A Little Help needed to walk in hospital room?: A Little Help needed climbing 3-5 steps with a railing? : A Little 6 Click Score: 20    End of Session Equipment Utilized During Treatment: Gait belt Activity Tolerance: Patient tolerated treatment well Patient left: in chair;with call bell/phone within reach Nurse Communication: Mobility status PT Visit Diagnosis: Other abnormalities of gait and mobility (R26.89);Pain Pain - Right/Left: Left Pain - part of body: Knee     Time: 5929-2446 PT Time Calculation (min) (ACUTE ONLY): 20 min  Charges:  $Gait Training: 8-22 mins                     [name], PT, DPT  Acute Rehabilitation Services  Pager: [phone] Office: [phone]    [name] 05/25/2019, 8:49 AM

## 2019-05-25 NOTE — Progress Notes (Signed)
05/25/19 1040  PT Visit Information  Last PT Received On 05/25/19  Assistance Needed +1  History of Present Illness Pt is a 68 y/o male s/p elective L TKA. PMH includes AAA, CAD, HTN, sleep apnea on CPAP, smoker.   Subjective Data  Patient Stated Goal to go home today  Precautions  Precautions Knee  Precaution Booklet Issued Yes (comment)  Precaution Comments REviewed knee precautions and supine HEP.   Restrictions  Weight Bearing Restrictions Yes  LLE Weight Bearing WBAT  Pain Assessment  Pain Assessment Faces  Faces Pain Scale 2  Pain Location L knee   Pain Descriptors / Indicators Aching;Operative site guarding  Pain Intervention(s) Limited activity within patient's tolerance;Monitored during session;Repositioned  Cognition  Arousal/Alertness Awake/alert  Behavior During Therapy WFL for tasks assessed/performed  Overall Cognitive Status Within Functional Limits for tasks assessed  Bed Mobility  Overal bed mobility Modified Independent  Transfers  Overall transfer level Needs assistance  Equipment used Rolling walker (2 wheeled)  Transfers Sit to/from Stand  Sit to Stand Supervision  General transfer comment Supervision for safety. Demonstrated safe hand placement.   Ambulation/Gait  Ambulation/Gait assistance Min guard;Supervision  Gait Distance (Feet) 300 Feet  Assistive device Rolling walker (2 wheeled)  Gait Pattern/deviations Step-through pattern;Decreased step length - right;Decreased step length - left;Decreased weight shift to left;Antalgic  General Gait Details Slow, slightly antalgic gait. Decreased knee extension noted in stance phase, however, pt reports slight increase in soreness since previous session.   Gait velocity Decreased   Stairs Yes  Stairs assistance Min guard  Stair Management One rail Right;Step to pattern;Forwards  Number of Stairs 4  General stair comments Slow, overall steady stair navigation. Cues for LE sequencing.   Balance  Overall  balance assessment Needs assistance  Sitting-balance support No upper extremity supported;Feet supported  Sitting balance-Leahy Scale Good  Standing balance support Bilateral upper extremity supported;During functional activity;No upper extremity supported  Standing balance-Leahy Scale Fair  Standing balance comment Able to maintain static standing without UE support   Exercises  Exercises Total Joint  Total Joint Exercises  Long Arc Quad AROM;Left;10 reps;Seated  Knee Flexion AROM;Left;10 reps;Seated (verbally reviewed AAROM knee flexion using RLE)  PT - End of Session  Equipment Utilized During Treatment Gait belt  Activity Tolerance Patient tolerated treatment well  Patient left with call bell/phone within reach;in bed  Nurse Communication Mobility status;Other (comment) (needs RW prior to d/c )  CPM Left Knee  CPM Left Knee Off   PT - Assessment/Plan  PT Plan Current plan remains appropriate  PT Visit Diagnosis Other abnormalities of gait and mobility (R26.89);Pain  Pain - Right/Left Left  Pain - part of body Knee  PT Frequency (ACUTE ONLY) 7X/week  Follow Up Recommendations Follow surgeon's recommendation for DC plan and follow-up therapies;Supervision for mobility/OOB  PT equipment Rolling walker with 5" wheels;3in1 (PT)  AM-PAC PT "6 Clicks" Mobility Outcome Measure (Version 2)  Help needed turning from your back to your side while in a flat bed without using bedrails? 4  Help needed moving from lying on your back to sitting on the side of a flat bed without using bedrails? 4  Help needed moving to and from a bed to a chair (including a wheelchair)? 3  Help needed standing up from a chair using your arms (e.g., wheelchair or bedside chair)? 4  Help needed to walk in hospital room? 3  Help needed climbing 3-5 steps with a railing?  3  6 Click Score 21  Consider Recommendation of Discharge To: Home with no services  PT Goal Progression  Progress towards PT goals Progressing  toward goals  Acute Rehab PT Goals  PT Goal Formulation With patient  Time For Goal Achievement 06/07/19  Potential to Achieve Goals Good  PT Time Calculation  PT Start Time (ACUTE ONLY) 1016  PT Stop Time (ACUTE ONLY) 1033  PT Time Calculation (min) (ACUTE ONLY) 17 min  PT General Charges  $$ ACUTE PT VISIT 1 Visit  PT Treatments  $Gait Training 8-22 mins    Pt progressing well towards goals. Increased gait distance and practiced stair navigation this session. Required min guard to supervision for mobility tasks. Educated about seated HEP. Pt eager to return home. Current recommendations appropriate. Will continue to follow acutely to maximize functional mobility independence and safety.   [name], PT, DPT  Acute Rehabilitation Services  Pager: [phone] Office: [phone]

## 2019-05-25 NOTE — Plan of Care (Signed)
  Problem: Clinical Measurements: Goal: Will remain free from infection Outcome: Progressing   Problem: Nutrition: Goal: Adequate nutrition will be maintained Outcome: Progressing   

## 2019-05-25 NOTE — Plan of Care (Signed)

## 2019-05-25 NOTE — Progress Notes (Addendum)
   Subjective: 1 Day Post-Op Procedure(s) (LRB): LEFT TOTAL KNEE ARTHROPLASTY (Left) Patient reports pain as mild and moderate.    Objective: Vital signs in last 24 hours: Temp:  [97.5 F (36.4 C)-98.2 F (36.8 C)] 98.2 F (36.8 C) (06/06 0836) Pulse Rate:  [52-92] 66 (06/06 0836) Resp:  [11-21] 16 (06/06 0836) BP: (103-138)/(53-73) 130/73 (06/06 0836) SpO2:  [95 %-100 %] 97 % (06/06 0836) Weight:  [120.2 kg] 120.2 kg (06/05 1013)  Intake/Output from previous day: 06/05 0701 - 06/06 0700 In: 2227.2 [P.O.:240; I.V.:1887.2; IV Piggyback:100] Out: 5320 [Urine:1700; Blood:25] Intake/Output this shift: No intake/output data recorded.  Recent Labs    05/25/19 0205  HGB 16.1   Recent Labs    05/25/19 0205  WBC 17.2*  RBC 5.44  HCT 47.4  PLT 174   Recent Labs    05/25/19 0205  NA 136  K 4.7  CL 104  CO2 23  BUN 26*  CREATININE 1.33*  GLUCOSE 131*  CALCIUM 9.1   No results for input(s): LABPT, INR in the last 72 hours.  Neurologically intact Dg Knee Left Port  Result Date: 05/24/2019 CLINICAL DATA:  Knee pain. EXAM: PORTABLE LEFT KNEE - 1-2 VIEW COMPARISON:  None. FINDINGS: Patient is status post total knee replacement. IMPRESSION: Satisfactory postoperative appearance. Electronically Signed   By: Staci Righter M.D.   On: 05/24/2019 18:45    Assessment/Plan: 1 Day Post-Op Procedure(s) (LRB): LEFT TOTAL KNEE ARTHROPLASTY (Left) Up with therapy, up to BR to clean up. Has mild increase in creatinine. Po fluids encouraged. " Dr. Erlinda Hong said I could go home today, I want to do stairs with the therapist today and go home" Marybelle Killings 05/25/2019, 9:34 AM

## 2019-05-25 NOTE — Progress Notes (Signed)
Discharge teaching and instructions given to pt. Pt does not have any questions and is not in distress. Changed dressing and applied mepilex dressing to left knee.

## 2019-05-27 ENCOUNTER — Telehealth: Payer: Self-pay | Admitting: *Deleted

## 2019-05-27 DIAGNOSIS — F17200 Nicotine dependence, unspecified, uncomplicated: Secondary | ICD-10-CM | POA: Diagnosis not present

## 2019-05-27 DIAGNOSIS — I2510 Atherosclerotic heart disease of native coronary artery without angina pectoris: Secondary | ICD-10-CM | POA: Diagnosis not present

## 2019-05-27 DIAGNOSIS — I714 Abdominal aortic aneurysm, without rupture: Secondary | ICD-10-CM | POA: Diagnosis not present

## 2019-05-27 DIAGNOSIS — Z471 Aftercare following joint replacement surgery: Secondary | ICD-10-CM | POA: Diagnosis not present

## 2019-05-27 DIAGNOSIS — E785 Hyperlipidemia, unspecified: Secondary | ICD-10-CM | POA: Diagnosis not present

## 2019-05-27 DIAGNOSIS — I10 Essential (primary) hypertension: Secondary | ICD-10-CM | POA: Diagnosis not present

## 2019-05-27 DIAGNOSIS — Z96652 Presence of left artificial knee joint: Secondary | ICD-10-CM | POA: Diagnosis not present

## 2019-05-27 NOTE — Telephone Encounter (Signed)
Ortho bundle discharge call made to patient.

## 2019-05-27 NOTE — Anesthesia Postprocedure Evaluation (Signed)
Anesthesia Post Note  Patient: Nathan Jones  Procedure(s) Performed: LEFT TOTAL KNEE ARTHROPLASTY (Left Knee)     Patient location during evaluation: PACU Anesthesia Type: Spinal Level of consciousness: oriented and awake and alert Pain management: pain level controlled Vital Signs Assessment: post-procedure vital signs reviewed and stable Respiratory status: spontaneous breathing and respiratory function stable Cardiovascular status: blood pressure returned to baseline and stable Postop Assessment: no headache, no backache and no apparent nausea or vomiting Anesthetic complications: no    Last Vitals:  Vitals:   05/25/19 0836 05/25/19 1441  BP: 130/73 122/63  Pulse: 66 69  Resp: 16 16  Temp: 36.8 C 37.1 C  SpO2: 97% 93%    Last Pain:  Vitals:   05/25/19 1441  TempSrc: Oral  PainSc:                  Lynda Rainwater

## 2019-05-27 NOTE — Care Plan (Signed)
RNCM call to patient. Confirmed he was discharged over the weekend (1 day post-op).  Reviewed post-op instructions regarding dressing. He reports discharge instructions from hospital instructed him to remove dressing in 7 days. RNCM confirmed that water resistant dressing should stay on until his post-op appointment on 06/06/19 with Dr. Erlinda Hong. Patient also indicated that he did not receive his DME- [facility] before leaving the hospital over the weekend even though RNCM had provided order through [facility] and confirmed with liaison. Patient reports HHPT is currently there and will be beginning therapy today. RNCM will schedule his OPPT for him to begin around his f/u appointment with Dr. Erlinda Hong. Patient confirmed he would like to have OPPT with [facility].

## 2019-05-28 ENCOUNTER — Encounter (HOSPITAL_COMMUNITY): Payer: Self-pay | Admitting: Orthopaedic Surgery

## 2019-05-28 NOTE — Discharge Summary (Signed)
Physician Discharge Summary      Patient ID: Nathan Jones MRN: [id number] DOB/AGE: [birth year] 68 y.o.  Admit date: 05/24/2019 Discharge date: 05/28/2019  Admission Diagnoses:  Primary osteoarthritis of left knee  Discharge Diagnoses:  Principal Problem:   Primary osteoarthritis of left knee   Past Medical History:  Diagnosis Date  . AAA (abdominal aortic aneurysm) (HCC)    3.4 cm 12/2018  . Arthritis   . CAD (coronary artery disease)    stent to LAD 2016  . Depression   . Hyperlipidemia   . Hypertension   . MMT (medial meniscus tear)    left knee  . Myocardial infarction ([place])    in 2016; stent to LAD.   Marland Kitchen Skin cancer   . Sleep apnea    uses CPAP nightly  . Smoker    1ppd    Surgeries: Procedure(s): LEFT TOTAL KNEE ARTHROPLASTY on 05/24/2019   Consultants (if any):   Discharged Condition: Improved  Hospital Course: Nathan Jones is an 68 y.o. male who was admitted 05/24/2019 with a diagnosis of Primary osteoarthritis of left knee and went to the operating room on 05/24/2019 and underwent the above named procedures.    He was given perioperative antibiotics:  Anti-infectives (From admission, onward)   Start     Dose/Rate Route Frequency Ordered Stop   05/24/19 1700  ceFAZolin (ANCEF) IVPB 2g/100 mL premix     2 g 200 mL/hr over 30 Minutes Intravenous Every 6 hours 05/24/19 1657 05/25/19 1059   05/24/19 1657  valACYclovir (VALTREX) tablet 1,000 mg  Status:  Discontinued     1,000 mg Oral Daily PRN 05/24/19 1657 05/25/19 2106   05/24/19 1305  vancomycin (VANCOCIN) powder  Status:  Discontinued       As needed 05/24/19 1323 05/24/19 1507   05/24/19 1100  ceFAZolin (ANCEF) 3 g in dextrose 5 % 50 mL IVPB     3 g 100 mL/hr over 30 Minutes Intravenous To [facility] 05/23/19 1134 05/24/19 1233    .  He was given sequential compression devices, early ambulation, and appropriate chemoprophylaxis for DVT prophylaxis.  He benefited maximally from the hospital stay and there  were no complications.    Recent vital signs:  Vitals:   05/25/19 0836 05/25/19 1441  BP: 130/73 122/63  Pulse: 66 69  Resp: 16 16  Temp: 98.2 F (36.8 C) 98.7 F (37.1 C)  SpO2: 97% 93%    Recent laboratory studies:  Lab Results  Component Value Date   HGB 16.1 05/25/2019   HGB 17.2 (H) 05/21/2019   Lab Results  Component Value Date   WBC 17.2 (H) 05/25/2019   PLT 174 05/25/2019   Lab Results  Component Value Date   INR 1.2 05/21/2019   Lab Results  Component Value Date   NA 136 05/25/2019   K 4.7 05/25/2019   CL 104 05/25/2019   CO2 23 05/25/2019   BUN 26 (H) 05/25/2019   CREATININE 1.33 (H) 05/25/2019   GLUCOSE 131 (H) 05/25/2019    Discharge Medications:   Allergies as of 05/25/2019      Reactions   Atorvastatin Other (See Comments)   Gas, stomach pains      Medication List    STOP taking these medications   Advil 200 MG tablet Generic drug:  ibuprofen     TAKE these medications   Advil PM 200-25 MG Caps Generic drug:  Ibuprofen-diphenhydrAMINE HCl Take 2 tablets by mouth at bedtime as needed (pain/sleep).  aspirin EC 81 MG tablet Take 1 tablet (81 mg total) by mouth 2 (two) times daily. What changed:  when to take this   ezetimibe 10 MG tablet Commonly known as:  ZETIA TAKE ONE TABLET BY MOUTH DAILY   lisinopril 20 MG tablet Commonly known as:  ZESTRIL Take 1 tablet (20 mg total) by mouth daily.   methocarbamol 750 MG tablet Commonly known as:  ROBAXIN Take 1 tablet (750 mg total) by mouth 2 (two) times daily as needed for muscle spasms.   metoprolol succinate 25 MG 24 hr tablet Commonly known as:  TOPROL-XL Take 25 mg by mouth every evening.   ondansetron 4 MG tablet Commonly known as:  ZOFRAN Take 1-2 tablets (4-8 mg total) by mouth every 8 (eight) hours as needed for nausea or vomiting.   oxyCODONE 5 MG immediate release tablet Commonly known as:  Oxy IR/ROXICODONE Take 1-3 tablets (5-15 mg total) by mouth 3 (three) times  daily as needed.   pravastatin 40 MG tablet Commonly known as:  PRAVACHOL Take 1 tablet (40 mg total) by mouth every evening.   promethazine 25 MG tablet Commonly known as:  PHENERGAN Take 1 tablet (25 mg total) by mouth every 6 (six) hours as needed for nausea.   senna-docusate 8.6-50 MG tablet Commonly known as:  Senokot S Take 1-2 tablets by mouth at bedtime as needed.   sertraline 50 MG tablet Commonly known as:  ZOLOFT Take 50 mg by mouth every evening.   VALTREX PO Take 1,000 mg by mouth daily as needed (cold sores).     ASK your doctor about these medications   oxyCODONE 10 mg 12 hr tablet Commonly known as:  OXYCONTIN Take 1 tablet (10 mg total) by mouth every 12 (twelve) hours for 3 days. Ask about: Should I take this medication?       Diagnostic Studies: Dg Chest 2 View  Result Date: 05/21/2019 CLINICAL DATA:  Preop left knee replacement EXAM: CHEST - 2 VIEW COMPARISON:  Chest CT 12/28/2017 FINDINGS: Heart is normal size. No confluent airspace opacities or effusions. No acute bony abnormality. IMPRESSION: No active cardiopulmonary disease. Electronically Signed   By: Rolm Baptise M.D.   On: 05/21/2019 09:11   Dg Knee Left Port  Result Date: 05/24/2019 CLINICAL DATA:  Knee pain. EXAM: PORTABLE LEFT KNEE - 1-2 VIEW COMPARISON:  None. FINDINGS: Patient is status post total knee replacement. IMPRESSION: Satisfactory postoperative appearance. Electronically Signed   By: Staci Righter M.D.   On: 05/24/2019 18:45    Disposition:     Follow-up Information    Nathan Koyanagi, MD. Go on 06/06/2019.   Specialty:  Orthopedic Surgery Why:  At 10:45 am For suture removal, For wound re-check Contact information: [place] [id number] [phone]        Home, [facility] Go to.   Specialty:  Home Health Services Why:  You will receive 5 Home Health PT visits once discharged from hospital prior to your follow up appointment with Dr. Erlinda Hong.  Someone will contact you once discharged. Contact information: [address] [place] [address] [phone]            Signed: Eduard Roux 05/28/2019, 3:41 PM

## 2019-05-29 ENCOUNTER — Other Ambulatory Visit: Payer: Self-pay | Admitting: Physician Assistant

## 2019-05-29 DIAGNOSIS — E785 Hyperlipidemia, unspecified: Secondary | ICD-10-CM | POA: Diagnosis not present

## 2019-05-29 DIAGNOSIS — I10 Essential (primary) hypertension: Secondary | ICD-10-CM | POA: Diagnosis not present

## 2019-05-29 DIAGNOSIS — F17200 Nicotine dependence, unspecified, uncomplicated: Secondary | ICD-10-CM | POA: Diagnosis not present

## 2019-05-29 DIAGNOSIS — I2510 Atherosclerotic heart disease of native coronary artery without angina pectoris: Secondary | ICD-10-CM | POA: Diagnosis not present

## 2019-05-29 DIAGNOSIS — I714 Abdominal aortic aneurysm, without rupture: Secondary | ICD-10-CM | POA: Diagnosis not present

## 2019-05-29 DIAGNOSIS — Z471 Aftercare following joint replacement surgery: Secondary | ICD-10-CM | POA: Diagnosis not present

## 2019-05-29 MED ORDER — OXYCODONE-ACETAMINOPHEN 5-325 MG PO TABS: 1.0000 | ORAL_TABLET | Freq: Four times a day (QID) | ORAL | 0 refills | Status: DC | PRN

## 2019-05-30 ENCOUNTER — Telehealth: Payer: Self-pay | Admitting: *Deleted

## 2019-05-30 NOTE — Telephone Encounter (Signed)
Ortho bundle 7 day post op call completed. ?

## 2019-05-30 NOTE — Care Plan (Signed)
RNCM discussed with [facility] liaison today that the patient will need his FWW that was ordered pre-op and not delivered to hospital prior to d/c home over the weekend. Discussed mix-up that took place on Ortho floor regarding DME. Liaison assured RNCM that he would have FWW delivered to patient's home today. RNCM confirmed receipt of RW later in the day. Also scheduled for OPPT for Left knee to begin with [facility] on Thursday, 06/06/19 at 12:30 pm after his scheduled appointment with Dr. Erlinda Hong that morning. Patient has been made aware and is agreeable. Overall, patient feels he is doing well.

## 2019-05-31 DIAGNOSIS — F17200 Nicotine dependence, unspecified, uncomplicated: Secondary | ICD-10-CM | POA: Diagnosis not present

## 2019-05-31 DIAGNOSIS — I714 Abdominal aortic aneurysm, without rupture: Secondary | ICD-10-CM | POA: Diagnosis not present

## 2019-05-31 DIAGNOSIS — I10 Essential (primary) hypertension: Secondary | ICD-10-CM | POA: Diagnosis not present

## 2019-05-31 DIAGNOSIS — I2510 Atherosclerotic heart disease of native coronary artery without angina pectoris: Secondary | ICD-10-CM | POA: Diagnosis not present

## 2019-05-31 DIAGNOSIS — Z471 Aftercare following joint replacement surgery: Secondary | ICD-10-CM | POA: Diagnosis not present

## 2019-05-31 DIAGNOSIS — E785 Hyperlipidemia, unspecified: Secondary | ICD-10-CM | POA: Diagnosis not present

## 2019-06-03 DIAGNOSIS — I2510 Atherosclerotic heart disease of native coronary artery without angina pectoris: Secondary | ICD-10-CM | POA: Diagnosis not present

## 2019-06-03 DIAGNOSIS — I714 Abdominal aortic aneurysm, without rupture: Secondary | ICD-10-CM | POA: Diagnosis not present

## 2019-06-03 DIAGNOSIS — E785 Hyperlipidemia, unspecified: Secondary | ICD-10-CM | POA: Diagnosis not present

## 2019-06-03 DIAGNOSIS — Z471 Aftercare following joint replacement surgery: Secondary | ICD-10-CM | POA: Diagnosis not present

## 2019-06-03 DIAGNOSIS — I10 Essential (primary) hypertension: Secondary | ICD-10-CM | POA: Diagnosis not present

## 2019-06-03 DIAGNOSIS — F17200 Nicotine dependence, unspecified, uncomplicated: Secondary | ICD-10-CM | POA: Diagnosis not present

## 2019-06-05 DIAGNOSIS — I714 Abdominal aortic aneurysm, without rupture: Secondary | ICD-10-CM | POA: Diagnosis not present

## 2019-06-05 DIAGNOSIS — F17200 Nicotine dependence, unspecified, uncomplicated: Secondary | ICD-10-CM | POA: Diagnosis not present

## 2019-06-05 DIAGNOSIS — I10 Essential (primary) hypertension: Secondary | ICD-10-CM | POA: Diagnosis not present

## 2019-06-05 DIAGNOSIS — E785 Hyperlipidemia, unspecified: Secondary | ICD-10-CM | POA: Diagnosis not present

## 2019-06-05 DIAGNOSIS — I2510 Atherosclerotic heart disease of native coronary artery without angina pectoris: Secondary | ICD-10-CM | POA: Diagnosis not present

## 2019-06-05 DIAGNOSIS — Z471 Aftercare following joint replacement surgery: Secondary | ICD-10-CM | POA: Diagnosis not present

## 2019-06-06 ENCOUNTER — Other Ambulatory Visit: Payer: Self-pay

## 2019-06-06 ENCOUNTER — Ambulatory Visit (INDEPENDENT_AMBULATORY_CARE_PROVIDER_SITE_OTHER): Payer: Medicare Other | Admitting: Orthopaedic Surgery

## 2019-06-06 ENCOUNTER — Encounter: Payer: Self-pay | Admitting: Orthopaedic Surgery

## 2019-06-06 ENCOUNTER — Other Ambulatory Visit: Payer: Self-pay | Admitting: Radiology

## 2019-06-06 DIAGNOSIS — Z96652 Presence of left artificial knee joint: Secondary | ICD-10-CM | POA: Diagnosis not present

## 2019-06-06 DIAGNOSIS — R262 Difficulty in walking, not elsewhere classified: Secondary | ICD-10-CM | POA: Diagnosis not present

## 2019-06-06 DIAGNOSIS — M6281 Muscle weakness (generalized): Secondary | ICD-10-CM | POA: Diagnosis not present

## 2019-06-06 DIAGNOSIS — M25562 Pain in left knee: Secondary | ICD-10-CM | POA: Diagnosis not present

## 2019-06-06 DIAGNOSIS — Z9889 Other specified postprocedural states: Secondary | ICD-10-CM

## 2019-06-06 NOTE — Progress Notes (Signed)
Patient ID: Nathan Jones, male   DOB: [birth year], 68 y.o.   MRN: [id number]  [name] is two-week status post left total knee replacement.  He is doing very well and progressing really well with physical therapy and range of motion.  He is taking Tylenol and oxycodone as needed as well as the aspirin for DVT prophylaxis.  His PT report is excellent.  His surgical incision is clean dry and intact without signs of infection.  No calf pain.  He has mild to moderate expected postoperative swelling.  No neurovascular compromise.  Today we remove the surgical dressing and placed Steri-Strips on the incision.  He may begin showering.  He is also welcome to take Advil for the pain.  He will call if he needs any refills on medicines.  He will begin outpatient physical therapy today.  We will recheck him in 4 weeks with three-view x-rays of the left knee.

## 2019-06-07 NOTE — Care Plan (Signed)
RNCM met with patient in office during his 2 week post-op visit with Dr. Erlinda Hong. He is using his RW for ambulation and reports he is doing quite well s/p Left-TKA on 05/24/2019. He has completed HHPT and is already scheduled today for his OPPT evaluation. His knee incision looks good without any signs/symptoms of infection. Reports Oxycodone doesn't really help with nighttime aching/pain. Continues aspirin twice daily for DVT prevention. MD ok'd use of Advil as needed for pain instead of Tylenol. He may now shower normally, but no submersion in baths, pools, ocean, lakes. He may drive in 2 more weeks. Patient demonstrates excellent range of motion in office today. Will continue to follow along for any further case management needs. Follow up scheduled in 4 weeks on 07/04/19 at 8:15 am with Dr. Erlinda Hong.

## 2019-06-11 DIAGNOSIS — Z96652 Presence of left artificial knee joint: Secondary | ICD-10-CM | POA: Diagnosis not present

## 2019-06-11 DIAGNOSIS — M6281 Muscle weakness (generalized): Secondary | ICD-10-CM | POA: Diagnosis not present

## 2019-06-11 DIAGNOSIS — M25562 Pain in left knee: Secondary | ICD-10-CM | POA: Diagnosis not present

## 2019-06-11 DIAGNOSIS — R262 Difficulty in walking, not elsewhere classified: Secondary | ICD-10-CM | POA: Diagnosis not present

## 2019-06-13 DIAGNOSIS — M25562 Pain in left knee: Secondary | ICD-10-CM | POA: Diagnosis not present

## 2019-06-13 DIAGNOSIS — R262 Difficulty in walking, not elsewhere classified: Secondary | ICD-10-CM | POA: Diagnosis not present

## 2019-06-13 DIAGNOSIS — Z96652 Presence of left artificial knee joint: Secondary | ICD-10-CM | POA: Diagnosis not present

## 2019-06-13 DIAGNOSIS — M6281 Muscle weakness (generalized): Secondary | ICD-10-CM | POA: Diagnosis not present

## 2019-06-17 DIAGNOSIS — M6281 Muscle weakness (generalized): Secondary | ICD-10-CM | POA: Diagnosis not present

## 2019-06-17 DIAGNOSIS — R262 Difficulty in walking, not elsewhere classified: Secondary | ICD-10-CM | POA: Diagnosis not present

## 2019-06-17 DIAGNOSIS — Z96652 Presence of left artificial knee joint: Secondary | ICD-10-CM | POA: Diagnosis not present

## 2019-06-17 DIAGNOSIS — M25562 Pain in left knee: Secondary | ICD-10-CM | POA: Diagnosis not present

## 2019-06-18 ENCOUNTER — Telehealth: Payer: Self-pay | Admitting: *Deleted

## 2019-06-18 NOTE — Telephone Encounter (Signed)
Ortho bundle RNCM call.

## 2019-06-18 NOTE — Care Plan (Signed)
RNCM received call from patient today stating that he is having popping without pain in the left knee, mostly laterally. He discussed with his OPPT, who was concerned and requested he contact Dr. Erlinda Hong. Patient describes popping just with walking, but stated that his therapist was able to tell it was popping while performing range of motion of the knee. Hasn't taken pain medication in a week; using Advil and Tylenol alternating for pain relief. RNCM discussed with Dr. Erlinda Hong, who indicated he would like to see him in clinic. Appointment scheduled for tomorrow, 06/19/2019 at 1:15 pm. Patient is scheduled to see therapy at 3:00 pm as well tomorrow. Patient is now able to drive per Dr. Erlinda Hong. Updated patient and he is agreeable to the scheduled appointment tomorrow.

## 2019-06-19 ENCOUNTER — Ambulatory Visit (INDEPENDENT_AMBULATORY_CARE_PROVIDER_SITE_OTHER): Payer: Medicare Other

## 2019-06-19 ENCOUNTER — Other Ambulatory Visit: Payer: Self-pay

## 2019-06-19 ENCOUNTER — Ambulatory Visit (INDEPENDENT_AMBULATORY_CARE_PROVIDER_SITE_OTHER): Payer: Medicare Other | Admitting: Physician Assistant

## 2019-06-19 DIAGNOSIS — M25562 Pain in left knee: Secondary | ICD-10-CM | POA: Diagnosis not present

## 2019-06-19 DIAGNOSIS — Z96652 Presence of left artificial knee joint: Secondary | ICD-10-CM

## 2019-06-19 DIAGNOSIS — M6281 Muscle weakness (generalized): Secondary | ICD-10-CM | POA: Diagnosis not present

## 2019-06-19 DIAGNOSIS — R262 Difficulty in walking, not elsewhere classified: Secondary | ICD-10-CM | POA: Diagnosis not present

## 2019-06-19 NOTE — Progress Notes (Signed)
Post-Op Visit Note   Patient: Nathan Jones           Date of Birth: Jul 24, 1951           MRN: [id number] Visit Date: 06/19/2019 PCP: Lavone Orn, MD   Assessment & Plan:  Chief Complaint:  Chief Complaint  Patient presents with  . Left Knee - Routine Post Op   Visit Diagnoses:  1. Status post total left knee replacement     Plan: [name] is a pleasant 68 year old gentleman presents our clinic today 26 days status post left total knee replacement, date of surgery 05/24/2019.  He has been doing relatively well.  He comes in with concerns about popping to the left knee that he recently noticed.  He has no associated pain.  The popping occurs with different motions of his knee merrily when ambulating.  He has been in formal physical therapy where he is making great progress.  Examination of his left knee reveals a fully healed surgical scar.  No evidence of infection or cellulitis.  Calf is soft nontender.  He does have a small effusion to the left knee.  Range of motion 5 degrees of extension lag to about 120 degrees of flexion.  He is stable valgus varus stress.  He is neurovascularly intact distally.  I have reassured Nathan Jones that the popping he is feeling is actually the plastic hitting the lateral implant.  He will continue with formal physical therapy.  He will follow-up with [place] in 8 weeks time for repeat evaluation.  Call with concerns or questions in the meantime.  Follow-Up Instructions: Return in about 8 weeks (around 08/14/2019).   Orders:  Orders Placed This Encounter  Procedures  . XR Knee 1-2 Views Left   No orders of the defined types were placed in this encounter.   Imaging: Xr Knee 1-2 Views Left  Result Date: 06/19/2019 Well-seated prosthesis without complication   PMFS History: Patient Active Problem List   Diagnosis Date Noted  . Primary osteoarthritis of left knee 04/15/2019  . S/P left knee arthroscopy 01/23/2019  . Synovitis of left knee 01/16/2019  . Bodies, loose,  joint, knee, left 01/16/2019  . Acute medial meniscus tear, left, initial encounter 12/28/2018  . CAD in native artery 05/19/2016  . Hyperlipidemia 05/19/2016  . Smoker 05/19/2016  . Abdominal aortic aneurysm (AAA) without rupture ([place]) 05/19/2016  . AAA (abdominal aortic aneurysm) ([place]) 11/11/2015  . Aortoiliac occlusive disease ([place]) 11/11/2015  . CAD (coronary artery disease), native coronary artery 09/08/2015  . S/P coronary artery stent placement 09/08/2015  . Essential hypertension 09/08/2015  . Dyslipidemia 09/08/2015  . ED (erectile dysfunction) 09/08/2015  . Dizziness 09/08/2015  . Tobacco abuse 09/08/2015   Past Medical History:  Diagnosis Date  . AAA (abdominal aortic aneurysm) (HCC)    3.4 cm 12/2018  . Arthritis   . CAD (coronary artery disease)    stent to LAD 2016  . Depression   . Hyperlipidemia   . Hypertension   . MMT (medial meniscus tear)    left knee  . Myocardial infarction ([place])    in 2016; stent to LAD.   Marland Kitchen Skin cancer   . Sleep apnea    uses CPAP nightly  . Smoker    1ppd    Family History  Problem Relation Age of Onset  . Heart disease Father        pacemaker  . Heart disease Maternal Grandfather   . Stroke Maternal Grandmother  Past Surgical History:  Procedure Laterality Date  . ACHILLES TENDON SURGERY Left 2018   Dr. Berenice Primas  . APPENDECTOMY  1988  . CARDIAC CATHETERIZATION  06/01/2015   Xience DES to LAD (3.57mmx15mm) - [facility]  . CERVICAL FUSION  2010  . CHOLECYSTECTOMY  1988  . CHONDROPLASTY Left 01/16/2019   Procedure: CHONDROPLASTY AND REMOVAL OF LOOSE BODY;  Surgeon: Leandrew Koyanagi, MD;  Location: [facility];  Service: Orthopedics;  Laterality: Left;  . KNEE ARTHROSCOPY WITH MEDIAL MENISECTOMY Left 01/16/2019   Procedure: LEFT KNEE ARTHROSCOPY WITH PARTIAL MEDIAL MENISCECTOMY;  Surgeon: Leandrew Koyanagi, MD;  Location: [facility];  Service: Orthopedics;  Laterality: Left;  . TOTAL KNEE  ARTHROPLASTY Left 05/24/2019   Procedure: LEFT TOTAL KNEE ARTHROPLASTY;  Surgeon: Leandrew Koyanagi, MD;  Location: [facility];  Service: Orthopedics;  Laterality: Left;   Social History   Occupational History  . Occupation: Primary school teacher    Comment: retired  Tobacco Use  . Smoking status: Current Every Day Smoker    Packs/day: 1.00    Years: 45.00    Pack years: 45.00    Types: Cigarettes  . Smokeless tobacco: Never Used  Substance and Sexual Activity  . Alcohol use: Yes    Alcohol/week: 0.0 standard drinks    Comment: social   . Drug use: No  . Sexual activity: Yes

## 2019-06-25 DIAGNOSIS — Z96652 Presence of left artificial knee joint: Secondary | ICD-10-CM | POA: Diagnosis not present

## 2019-06-25 DIAGNOSIS — M25562 Pain in left knee: Secondary | ICD-10-CM | POA: Diagnosis not present

## 2019-06-25 DIAGNOSIS — M6281 Muscle weakness (generalized): Secondary | ICD-10-CM | POA: Diagnosis not present

## 2019-06-25 DIAGNOSIS — R262 Difficulty in walking, not elsewhere classified: Secondary | ICD-10-CM | POA: Diagnosis not present

## 2019-06-27 DIAGNOSIS — M25562 Pain in left knee: Secondary | ICD-10-CM | POA: Diagnosis not present

## 2019-06-27 DIAGNOSIS — Z96652 Presence of left artificial knee joint: Secondary | ICD-10-CM | POA: Diagnosis not present

## 2019-06-27 DIAGNOSIS — M6281 Muscle weakness (generalized): Secondary | ICD-10-CM | POA: Diagnosis not present

## 2019-06-27 DIAGNOSIS — R262 Difficulty in walking, not elsewhere classified: Secondary | ICD-10-CM | POA: Diagnosis not present

## 2019-07-01 ENCOUNTER — Telehealth: Payer: Self-pay | Admitting: *Deleted

## 2019-07-01 NOTE — Care Plan (Signed)
RNCM spoke with patient today for a 30 day phone call. He is actually a little over 30 days from surgery, but RNCM was out of office last week. Patient reports he is doing very well. Continues to attend therapy twice weekly. He states that he is still concerned regarding the "popping" sensation he feels with ambulation, but still having no pain. Reviewed last office note from 06/19/2019 with Dr. Phoebe Sharps PA.  Reviewed next upcoming appointment with Dr. Erlinda Hong in August. Reminded to contact RNCM with any questions or needs. 30 day survey completed that has been replaced with 4 questions created by [facility].  Patient Satisfaction Survey: 1. Before surgery, I was provided sufficient education regarding my surgery and the bundle program. Strongly agree 2. I was satisfied with the care provided by the nurse at the facility where my surgery was performed. Strongly agree 3. Following surgery, I received sufficient postoperative care instructions. Strongly agree 4. I would recommend my surgeon and this bundle program to others. Strongly agree

## 2019-07-01 NOTE — Telephone Encounter (Signed)
30 day Ortho bundle phone call and survey completed.

## 2019-07-02 DIAGNOSIS — R262 Difficulty in walking, not elsewhere classified: Secondary | ICD-10-CM | POA: Diagnosis not present

## 2019-07-02 DIAGNOSIS — M25562 Pain in left knee: Secondary | ICD-10-CM | POA: Diagnosis not present

## 2019-07-02 DIAGNOSIS — Z96652 Presence of left artificial knee joint: Secondary | ICD-10-CM | POA: Diagnosis not present

## 2019-07-02 DIAGNOSIS — M6281 Muscle weakness (generalized): Secondary | ICD-10-CM | POA: Diagnosis not present

## 2019-07-04 ENCOUNTER — Ambulatory Visit: Payer: Medicare Other | Admitting: Orthopaedic Surgery

## 2019-07-04 DIAGNOSIS — M25562 Pain in left knee: Secondary | ICD-10-CM | POA: Diagnosis not present

## 2019-07-04 DIAGNOSIS — Z96652 Presence of left artificial knee joint: Secondary | ICD-10-CM | POA: Diagnosis not present

## 2019-07-04 DIAGNOSIS — L820 Inflamed seborrheic keratosis: Secondary | ICD-10-CM | POA: Diagnosis not present

## 2019-07-04 DIAGNOSIS — Z85828 Personal history of other malignant neoplasm of skin: Secondary | ICD-10-CM | POA: Diagnosis not present

## 2019-07-04 DIAGNOSIS — R262 Difficulty in walking, not elsewhere classified: Secondary | ICD-10-CM | POA: Diagnosis not present

## 2019-07-04 DIAGNOSIS — L814 Other melanin hyperpigmentation: Secondary | ICD-10-CM | POA: Diagnosis not present

## 2019-07-04 DIAGNOSIS — D1801 Hemangioma of skin and subcutaneous tissue: Secondary | ICD-10-CM | POA: Diagnosis not present

## 2019-07-04 DIAGNOSIS — M6281 Muscle weakness (generalized): Secondary | ICD-10-CM | POA: Diagnosis not present

## 2019-07-04 DIAGNOSIS — L821 Other seborrheic keratosis: Secondary | ICD-10-CM | POA: Diagnosis not present

## 2019-07-04 DIAGNOSIS — L570 Actinic keratosis: Secondary | ICD-10-CM | POA: Diagnosis not present

## 2019-07-04 DIAGNOSIS — D225 Melanocytic nevi of trunk: Secondary | ICD-10-CM | POA: Diagnosis not present

## 2019-07-09 DIAGNOSIS — Z0000 Encounter for general adult medical examination without abnormal findings: Secondary | ICD-10-CM | POA: Diagnosis not present

## 2019-07-09 DIAGNOSIS — Z1389 Encounter for screening for other disorder: Secondary | ICD-10-CM | POA: Diagnosis not present

## 2019-07-11 DIAGNOSIS — Z96652 Presence of left artificial knee joint: Secondary | ICD-10-CM | POA: Diagnosis not present

## 2019-07-11 DIAGNOSIS — R262 Difficulty in walking, not elsewhere classified: Secondary | ICD-10-CM | POA: Diagnosis not present

## 2019-07-11 DIAGNOSIS — M25562 Pain in left knee: Secondary | ICD-10-CM | POA: Diagnosis not present

## 2019-07-11 DIAGNOSIS — M6281 Muscle weakness (generalized): Secondary | ICD-10-CM | POA: Diagnosis not present

## 2019-07-18 DIAGNOSIS — Z96652 Presence of left artificial knee joint: Secondary | ICD-10-CM | POA: Diagnosis not present

## 2019-07-18 DIAGNOSIS — R262 Difficulty in walking, not elsewhere classified: Secondary | ICD-10-CM | POA: Diagnosis not present

## 2019-07-18 DIAGNOSIS — M6281 Muscle weakness (generalized): Secondary | ICD-10-CM | POA: Diagnosis not present

## 2019-07-18 DIAGNOSIS — M25562 Pain in left knee: Secondary | ICD-10-CM | POA: Diagnosis not present

## 2019-07-30 DIAGNOSIS — Z96652 Presence of left artificial knee joint: Secondary | ICD-10-CM | POA: Diagnosis not present

## 2019-07-30 DIAGNOSIS — M25562 Pain in left knee: Secondary | ICD-10-CM | POA: Diagnosis not present

## 2019-07-30 DIAGNOSIS — M6281 Muscle weakness (generalized): Secondary | ICD-10-CM | POA: Diagnosis not present

## 2019-07-30 DIAGNOSIS — R262 Difficulty in walking, not elsewhere classified: Secondary | ICD-10-CM | POA: Diagnosis not present

## 2019-08-05 DIAGNOSIS — F329 Major depressive disorder, single episode, unspecified: Secondary | ICD-10-CM | POA: Diagnosis not present

## 2019-08-05 DIAGNOSIS — I10 Essential (primary) hypertension: Secondary | ICD-10-CM | POA: Diagnosis not present

## 2019-08-05 DIAGNOSIS — I2510 Atherosclerotic heart disease of native coronary artery without angina pectoris: Secondary | ICD-10-CM | POA: Diagnosis not present

## 2019-08-05 DIAGNOSIS — I252 Old myocardial infarction: Secondary | ICD-10-CM | POA: Diagnosis not present

## 2019-08-09 DIAGNOSIS — M25562 Pain in left knee: Secondary | ICD-10-CM | POA: Diagnosis not present

## 2019-08-09 DIAGNOSIS — R262 Difficulty in walking, not elsewhere classified: Secondary | ICD-10-CM | POA: Diagnosis not present

## 2019-08-09 DIAGNOSIS — M6281 Muscle weakness (generalized): Secondary | ICD-10-CM | POA: Diagnosis not present

## 2019-08-09 DIAGNOSIS — Z96652 Presence of left artificial knee joint: Secondary | ICD-10-CM | POA: Diagnosis not present

## 2019-08-12 DIAGNOSIS — E669 Obesity, unspecified: Secondary | ICD-10-CM | POA: Diagnosis not present

## 2019-08-12 DIAGNOSIS — F329 Major depressive disorder, single episode, unspecified: Secondary | ICD-10-CM | POA: Diagnosis not present

## 2019-08-12 DIAGNOSIS — F5104 Psychophysiologic insomnia: Secondary | ICD-10-CM | POA: Diagnosis not present

## 2019-08-12 DIAGNOSIS — G4733 Obstructive sleep apnea (adult) (pediatric): Secondary | ICD-10-CM | POA: Diagnosis not present

## 2019-08-12 DIAGNOSIS — F17200 Nicotine dependence, unspecified, uncomplicated: Secondary | ICD-10-CM | POA: Diagnosis not present

## 2019-08-12 DIAGNOSIS — I10 Essential (primary) hypertension: Secondary | ICD-10-CM | POA: Diagnosis not present

## 2019-08-12 DIAGNOSIS — E7800 Pure hypercholesterolemia, unspecified: Secondary | ICD-10-CM | POA: Diagnosis not present

## 2019-08-12 DIAGNOSIS — I739 Peripheral vascular disease, unspecified: Secondary | ICD-10-CM | POA: Diagnosis not present

## 2019-08-12 DIAGNOSIS — I2510 Atherosclerotic heart disease of native coronary artery without angina pectoris: Secondary | ICD-10-CM | POA: Diagnosis not present

## 2019-08-14 ENCOUNTER — Ambulatory Visit (INDEPENDENT_AMBULATORY_CARE_PROVIDER_SITE_OTHER): Payer: Medicare Other | Admitting: Physician Assistant

## 2019-08-14 ENCOUNTER — Encounter: Payer: Self-pay | Admitting: Orthopaedic Surgery

## 2019-08-14 ENCOUNTER — Other Ambulatory Visit: Payer: Self-pay | Admitting: Physician Assistant

## 2019-08-14 DIAGNOSIS — Z96652 Presence of left artificial knee joint: Secondary | ICD-10-CM

## 2019-08-14 MED ORDER — AMOXICILLIN 500 MG PO CAPS: ORAL_CAPSULE | ORAL | 0 refills | Status: AC

## 2019-08-14 NOTE — Progress Notes (Signed)
Post-Op Visit Note   Patient: Nathan Jones           Date of Birth: [birth year]           MRN: [id number] Visit Date: 08/14/2019 PCP: Lavone Orn, MD   Assessment & Plan:  Chief Complaint:  Chief Complaint  Patient presents with  . Left Knee - Follow-up   Visit Diagnoses:  1. S/P TKR (total knee replacement), left     Plan: Patient is a pleasant 68 year old gentleman who presents our clinic today 3 months status post left total knee replacement, date of surgery 05/24/2019.  He has been doing very well.  He has finished physical therapy.  He still has a fair amount of swelling but continues to increase his activity.  He takes Advil as needed.  He also still notices a popping sensation when flexing his knee.  Examination of his left knee reveals a fully healed surgical scar.  Range of motion 5 degrees of extension lag to 125 degrees of flexion.  He is stable to valgus and varus stress.  He is neurovascularly intact distally.  At this point, he will continue to increase activity as tolerated.  He will follow-up with [place] in 3 months time for two-view x-rays of the left knee.  Call with concerns or questions in the meantime.  Follow-Up Instructions: Return in about 3 months (around 11/14/2019).   Orders:  No orders of the defined types were placed in this encounter.  No orders of the defined types were placed in this encounter.   Imaging: No new imaging  PMFS History: Patient Active Problem List   Diagnosis Date Noted  . Primary osteoarthritis of left knee 04/15/2019  . S/P left knee arthroscopy 01/23/2019  . Synovitis of left knee 01/16/2019  . Bodies, loose, joint, knee, left 01/16/2019  . Acute medial meniscus tear, left, initial encounter 12/28/2018  . CAD in native artery 05/19/2016  . Hyperlipidemia 05/19/2016  . Smoker 05/19/2016  . Abdominal aortic aneurysm (AAA) without rupture ([place]) 05/19/2016  . AAA (abdominal aortic aneurysm) ([place]) 11/11/2015  . Aortoiliac occlusive  disease ([place]) 11/11/2015  . CAD (coronary artery disease), native coronary artery 09/08/2015  . S/P coronary artery stent placement 09/08/2015  . Essential hypertension 09/08/2015  . Dyslipidemia 09/08/2015  . ED (erectile dysfunction) 09/08/2015  . Dizziness 09/08/2015  . Tobacco abuse 09/08/2015   Past Medical History:  Diagnosis Date  . AAA (abdominal aortic aneurysm) (HCC)    3.4 cm 12/2018  . Arthritis   . CAD (coronary artery disease)    stent to LAD 2016  . Depression   . Hyperlipidemia   . Hypertension   . MMT (medial meniscus tear)    left knee  . Myocardial infarction ([place])    in 2016; stent to LAD.   Marland Kitchen Skin cancer   . Sleep apnea    uses CPAP nightly  . Smoker    1ppd    Family History  Problem Relation Age of Onset  . Heart disease Father        pacemaker  . Heart disease Maternal Grandfather   . Stroke Maternal Grandmother     Past Surgical History:  Procedure Laterality Date  . ACHILLES TENDON SURGERY Left 2018   Dr. Berenice Primas  . APPENDECTOMY  1988  . CARDIAC CATHETERIZATION  06/01/2015   Xience DES to LAD (3.25mmx15mm) - [facility]  . CERVICAL FUSION  2010  . CHOLECYSTECTOMY  1988  . CHONDROPLASTY Left 01/16/2019  Procedure: CHONDROPLASTY AND REMOVAL OF LOOSE BODY;  Surgeon: Leandrew Koyanagi, MD;  Location: [facility];  Service: Orthopedics;  Laterality: Left;  . KNEE ARTHROSCOPY WITH MEDIAL MENISECTOMY Left 01/16/2019   Procedure: LEFT KNEE ARTHROSCOPY WITH PARTIAL MEDIAL MENISCECTOMY;  Surgeon: Leandrew Koyanagi, MD;  Location: [facility];  Service: Orthopedics;  Laterality: Left;  . TOTAL KNEE ARTHROPLASTY Left 05/24/2019   Procedure: LEFT TOTAL KNEE ARTHROPLASTY;  Surgeon: Leandrew Koyanagi, MD;  Location: [facility];  Service: Orthopedics;  Laterality: Left;   Social History   Occupational History  . Occupation: Primary school teacher    Comment: retired  Tobacco Use  . Smoking status: Current Every Day Smoker    Packs/day:  1.00    Years: 45.00    Pack years: 45.00    Types: Cigarettes  . Smokeless tobacco: Never Used  Substance and Sexual Activity  . Alcohol use: Yes    Alcohol/week: 0.0 standard drinks    Comment: social   . Drug use: No  . Sexual activity: Yes

## 2019-09-02 ENCOUNTER — Telehealth: Payer: Self-pay | Admitting: *Deleted

## 2019-09-02 NOTE — Telephone Encounter (Signed)
90 day Ortho bundle call completed. 

## 2019-09-02 NOTE — Care Plan (Signed)
RNCM call to patient to review 90 day Nathan Jones, Jr. As well as check status at 90 days post-op. Patient verbalized he is doing well overall. Still having some mild popping, which is improving. Also notes some sharp, shooting pains only occasionally laterally to the knee while turning in bed at night. Stiffness remains biggest issue at the moment, but overall pleased with outcome.

## 2019-09-12 NOTE — Telephone Encounter (Signed)
Ortho bundle call completed. 

## 2019-09-23 DIAGNOSIS — I2510 Atherosclerotic heart disease of native coronary artery without angina pectoris: Secondary | ICD-10-CM | POA: Diagnosis not present

## 2019-09-23 DIAGNOSIS — F329 Major depressive disorder, single episode, unspecified: Secondary | ICD-10-CM | POA: Diagnosis not present

## 2019-09-23 DIAGNOSIS — I252 Old myocardial infarction: Secondary | ICD-10-CM | POA: Diagnosis not present

## 2019-09-23 DIAGNOSIS — I10 Essential (primary) hypertension: Secondary | ICD-10-CM | POA: Diagnosis not present

## 2019-09-23 DIAGNOSIS — E7800 Pure hypercholesterolemia, unspecified: Secondary | ICD-10-CM | POA: Diagnosis not present

## 2019-11-04 DIAGNOSIS — L84 Corns and callosities: Secondary | ICD-10-CM | POA: Diagnosis not present

## 2019-11-04 DIAGNOSIS — L821 Other seborrheic keratosis: Secondary | ICD-10-CM | POA: Diagnosis not present

## 2019-11-04 DIAGNOSIS — D485 Neoplasm of uncertain behavior of skin: Secondary | ICD-10-CM | POA: Diagnosis not present

## 2019-11-04 DIAGNOSIS — Z85828 Personal history of other malignant neoplasm of skin: Secondary | ICD-10-CM | POA: Diagnosis not present

## 2019-11-04 DIAGNOSIS — D225 Melanocytic nevi of trunk: Secondary | ICD-10-CM | POA: Diagnosis not present

## 2019-11-04 DIAGNOSIS — D2262 Melanocytic nevi of left upper limb, including shoulder: Secondary | ICD-10-CM | POA: Diagnosis not present

## 2019-11-04 DIAGNOSIS — L814 Other melanin hyperpigmentation: Secondary | ICD-10-CM | POA: Diagnosis not present

## 2019-11-04 DIAGNOSIS — D1801 Hemangioma of skin and subcutaneous tissue: Secondary | ICD-10-CM | POA: Diagnosis not present

## 2019-11-04 DIAGNOSIS — L570 Actinic keratosis: Secondary | ICD-10-CM | POA: Diagnosis not present

## 2019-11-13 ENCOUNTER — Encounter: Payer: Self-pay | Admitting: Orthopaedic Surgery

## 2019-11-13 ENCOUNTER — Other Ambulatory Visit: Payer: Self-pay

## 2019-11-13 ENCOUNTER — Ambulatory Visit (INDEPENDENT_AMBULATORY_CARE_PROVIDER_SITE_OTHER): Payer: Medicare Other

## 2019-11-13 ENCOUNTER — Ambulatory Visit (INDEPENDENT_AMBULATORY_CARE_PROVIDER_SITE_OTHER): Payer: Medicare Other | Admitting: Orthopaedic Surgery

## 2019-11-13 DIAGNOSIS — Z96652 Presence of left artificial knee joint: Secondary | ICD-10-CM

## 2019-11-13 DIAGNOSIS — I2510 Atherosclerotic heart disease of native coronary artery without angina pectoris: Secondary | ICD-10-CM | POA: Diagnosis not present

## 2019-11-13 NOTE — Progress Notes (Signed)
Office Visit Note   Patient: Nathan Jones           Date of Birth: [birth year]           MRN: [id number] Visit Date: 11/13/2019              Requested by: Lavone Orn, MD [address] [place],  [place] [address] PCP: Lavone Orn, MD   Assessment & Plan: Visit Diagnoses:  1. Status post total left knee replacement     Plan: Aemon is doing well for his 70-month visit.  Dental prophylaxis was reinforced.  He is very active and is doing well overall.  I would like to recheck him in 6 months for his 1 year visit with 2 view x-rays of the left knee.  Follow-Up Instructions: Return in about 1 year (around 11/12/2020).   Orders:  Orders Placed This Encounter  Procedures  . XR Knee 1-2 Views Left   No orders of the defined types were placed in this encounter.     Procedures: No procedures performed   Clinical Data: No additional findings.   Subjective: Chief Complaint  Patient presents with  . Left Knee - Pain    [name] is 6 months status post left total knee replacement.  Overall he is doing well and happy with the knee replacement.  He takes Advil as needed.  He has returned back to his normal activities.  He has noticed a tremendous improvement in terms of knee pain since having the knee replacement.   Review of Systems   Objective: Vital Signs: There were no vitals taken for this visit.  Physical Exam  Ortho Exam Left knee exam shows a fully healed surgical scar with excellent range of motion.  He has a trace joint effusion.  Collaterals are stable. Specialty Comments:  No specialty comments available.  Imaging: Xr Knee 1-2 Views Left  Result Date: 11/13/2019 Stable total knee replacement in good alignment.     PMFS History: Patient Active Problem List   Diagnosis Date Noted  . Primary osteoarthritis of left knee 04/15/2019  . S/P left knee arthroscopy 01/23/2019  . Synovitis of left knee 01/16/2019  . Bodies, loose, joint, knee, left  01/16/2019  . Acute medial meniscus tear, left, initial encounter 12/28/2018  . CAD in native artery 05/19/2016  . Hyperlipidemia 05/19/2016  . Smoker 05/19/2016  . Abdominal aortic aneurysm (AAA) without rupture ([place]) 05/19/2016  . AAA (abdominal aortic aneurysm) ([place]) 11/11/2015  . Aortoiliac occlusive disease ([place]) 11/11/2015  . CAD (coronary artery disease), native coronary artery 09/08/2015  . S/P coronary artery stent placement 09/08/2015  . Essential hypertension 09/08/2015  . Dyslipidemia 09/08/2015  . ED (erectile dysfunction) 09/08/2015  . Dizziness 09/08/2015  . Tobacco abuse 09/08/2015   Past Medical History:  Diagnosis Date  . AAA (abdominal aortic aneurysm) (H[facility])    3.4 cm 12/2018  . Arthritis   . CAD (coronary artery disease)    stent to LAD 2016  . Depression   . Hyperlipidemia   . Hypertension   . MMT (medial meniscus tear)    left knee  . Myocardial infarction ([place])    in 2016; stent to LAD.   Marland Kitchen Skin cancer   . Sleep apnea    uses CPAP nightly  . Smoker    1ppd    Family History  Problem Relation Age of Onset  . Heart disease Father        pacemaker  . Heart disease  Maternal Grandfather   . Stroke Maternal Grandmother     Past Surgical History:  Procedure Laterality Date  . ACHILLES TENDON SURGERY Left 2018   Dr. Berenice Primas  . APPENDECTOMY  1988  . CARDIAC CATHETERIZATION  06/01/2015   Xience DES to LAD (3.70mmx15mm) - [facility]  . CERVICAL FUSION  2010  . CHOLECYSTECTOMY  1988  . CHONDROPLASTY Left 01/16/2019   Procedure: CHONDROPLASTY AND REMOVAL OF LOOSE BODY;  Surgeon: Leandrew Koyanagi, MD;  Location: [facility];  Service: Orthopedics;  Laterality: Left;  . KNEE ARTHROSCOPY WITH MEDIAL MENISECTOMY Left 01/16/2019   Procedure: LEFT KNEE ARTHROSCOPY WITH PARTIAL MEDIAL MENISCECTOMY;  Surgeon: Leandrew Koyanagi, MD;  Location: [facility];  Service: Orthopedics;  Laterality: Left;  . TOTAL KNEE ARTHROPLASTY Left  05/24/2019   Procedure: LEFT TOTAL KNEE ARTHROPLASTY;  Surgeon: Leandrew Koyanagi, MD;  Location: [facility];  Service: Orthopedics;  Laterality: Left;   Social History   Occupational History  . Occupation: Primary school teacher    Comment: retired  Tobacco Use  . Smoking status: Current Every Day Smoker    Packs/day: 1.00    Years: 45.00    Pack years: 45.00    Types: Cigarettes  . Smokeless tobacco: Never Used  Substance and Sexual Activity  . Alcohol use: Yes    Alcohol/week: 0.0 standard drinks    Comment: social   . Drug use: No  . Sexual activity: Yes

## 2020-01-16 ENCOUNTER — Other Ambulatory Visit (HOSPITAL_COMMUNITY): Payer: Self-pay | Admitting: Internal Medicine

## 2020-01-16 ENCOUNTER — Other Ambulatory Visit: Payer: Self-pay

## 2020-01-16 ENCOUNTER — Ambulatory Visit (HOSPITAL_COMMUNITY)
Admission: RE | Admit: 2020-01-16 | Discharge: 2020-01-16 | Disposition: A | Discharge status: Home / Self Care | Payer: Medicare Other | Source: Ambulatory Visit | Attending: Internal Medicine | Admitting: Internal Medicine

## 2020-01-16 DIAGNOSIS — I714 Abdominal aortic aneurysm, without rupture, unspecified: Secondary | ICD-10-CM

## 2020-01-17 DIAGNOSIS — E7800 Pure hypercholesterolemia, unspecified: Secondary | ICD-10-CM | POA: Diagnosis not present

## 2020-01-17 DIAGNOSIS — F329 Major depressive disorder, single episode, unspecified: Secondary | ICD-10-CM | POA: Diagnosis not present

## 2020-01-17 DIAGNOSIS — I2510 Atherosclerotic heart disease of native coronary artery without angina pectoris: Secondary | ICD-10-CM | POA: Diagnosis not present

## 2020-01-17 DIAGNOSIS — I10 Essential (primary) hypertension: Secondary | ICD-10-CM | POA: Diagnosis not present

## 2020-01-17 DIAGNOSIS — I252 Old myocardial infarction: Secondary | ICD-10-CM | POA: Diagnosis not present

## 2020-02-14 ENCOUNTER — Ambulatory Visit
Admission: RE | Admit: 2020-02-14 | Discharge: 2020-02-14 | Disposition: A | Discharge status: Home / Self Care | Payer: Medicare Other | Source: Ambulatory Visit | Attending: Internal Medicine | Admitting: Internal Medicine

## 2020-02-14 ENCOUNTER — Other Ambulatory Visit: Payer: Self-pay | Admitting: Internal Medicine

## 2020-02-14 DIAGNOSIS — F329 Major depressive disorder, single episode, unspecified: Secondary | ICD-10-CM | POA: Diagnosis not present

## 2020-02-14 DIAGNOSIS — F17200 Nicotine dependence, unspecified, uncomplicated: Secondary | ICD-10-CM | POA: Diagnosis not present

## 2020-02-14 DIAGNOSIS — I10 Essential (primary) hypertension: Secondary | ICD-10-CM | POA: Diagnosis not present

## 2020-02-14 DIAGNOSIS — R0602 Shortness of breath: Secondary | ICD-10-CM | POA: Diagnosis not present

## 2020-02-14 DIAGNOSIS — R0609 Other forms of dyspnea: Secondary | ICD-10-CM

## 2020-02-14 DIAGNOSIS — R0600 Dyspnea, unspecified: Secondary | ICD-10-CM | POA: Diagnosis not present

## 2020-03-02 IMAGING — MR MR KNEE*L* W/O CM
4 of 6 series · 23 of 40 positions shown · non-contrast
Comparison: None.

CLINICAL DATA: Medial right knee pain for 2 months. No known
injury.

EXAM:
MRI OF THE LEFT KNEE WITHOUT CONTRAST
TECHNIQUE: Multiplanar, multisequence MR imaging of the knee was performed. No
intravenous contrast was administered.

[Series 5: T2 fat-sat · coronal · 4.0mm · 0.59mm/px · 6 of 29 slices shown (1 of 2)]
[im 1/29]
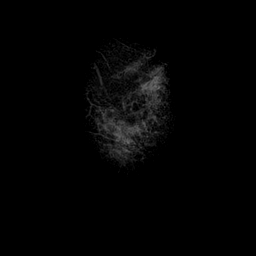
[im 6/29]
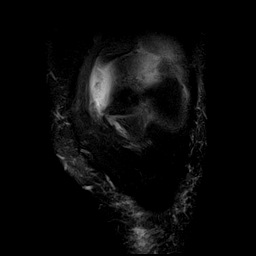
[im 12/29]
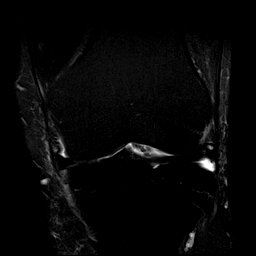
[im 17/29]
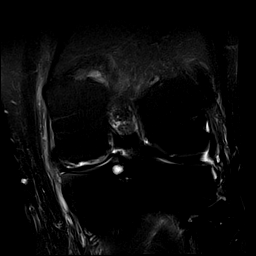
[im 23/29]
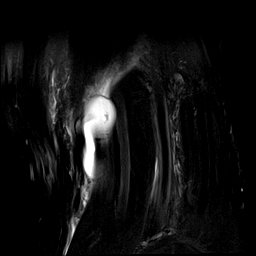
[im 29/29]
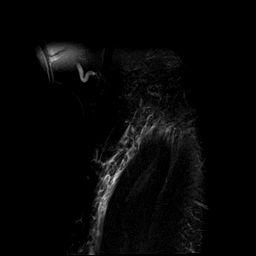

[Series 6: T1 · coronal · 4.0mm · 0.29mm/px · 5 of 29 slices shown]
[im 1/29]
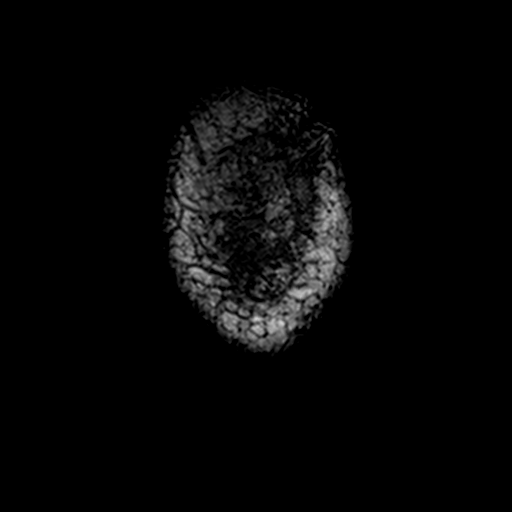
[im 6/29]
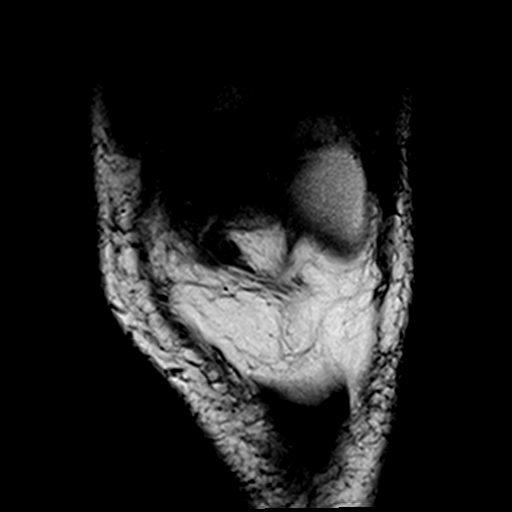
[im 12/29]
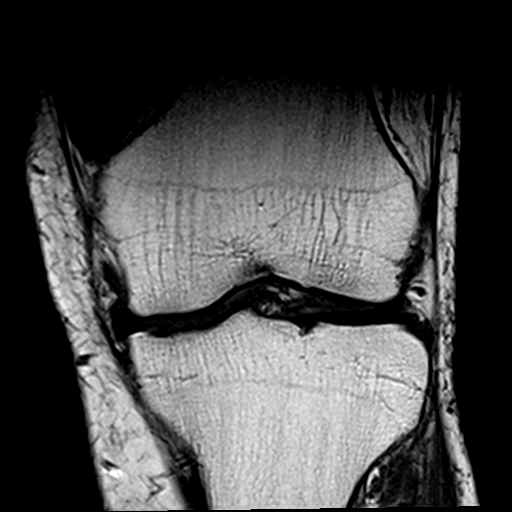
[im 17/29]
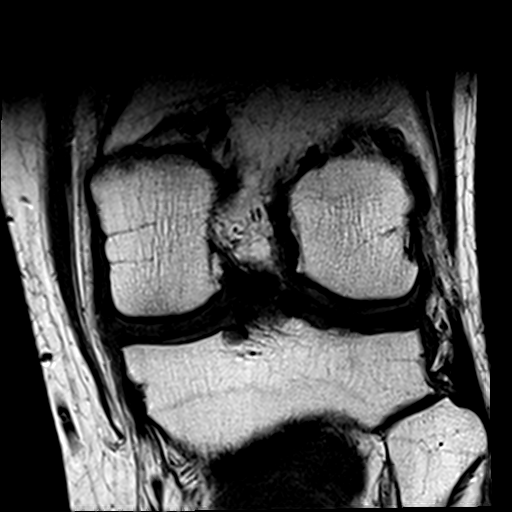
[im 29/29]
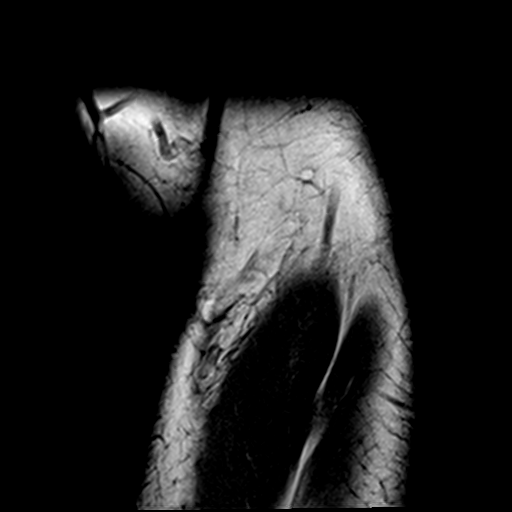

[Series 8: PD fat-sat · sagittal · 3.0mm · 0.29mm/px · 6 of 32 slices shown]
[im 1/32]
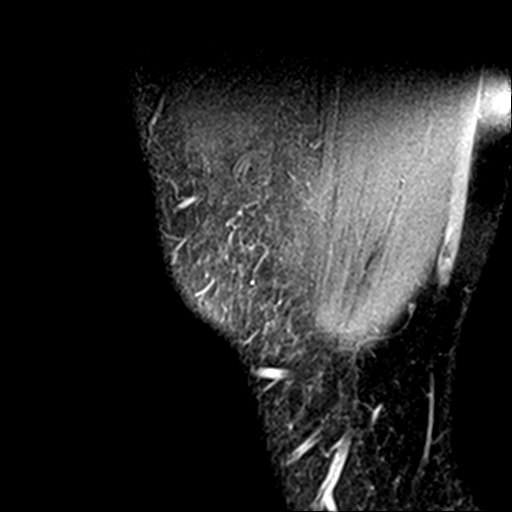
[im 7/32]
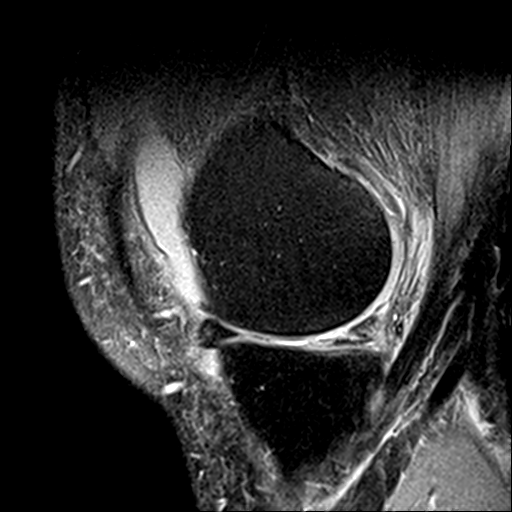
[im 13/32]
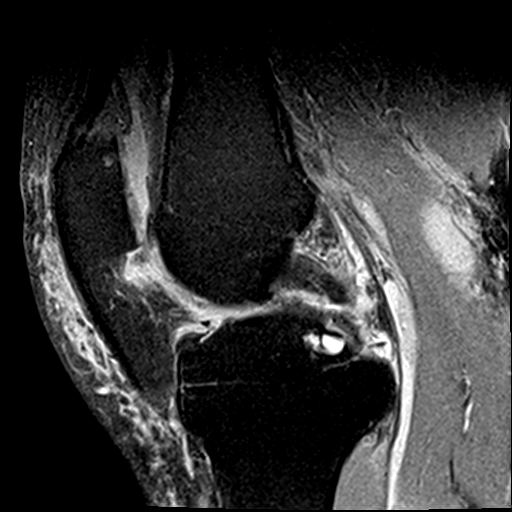
[im 19/32]
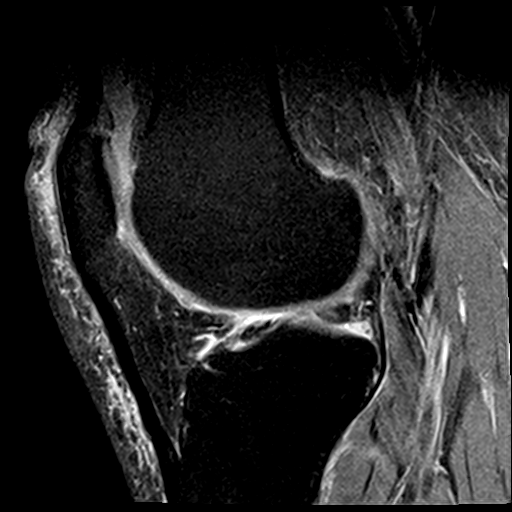
[im 25/32]
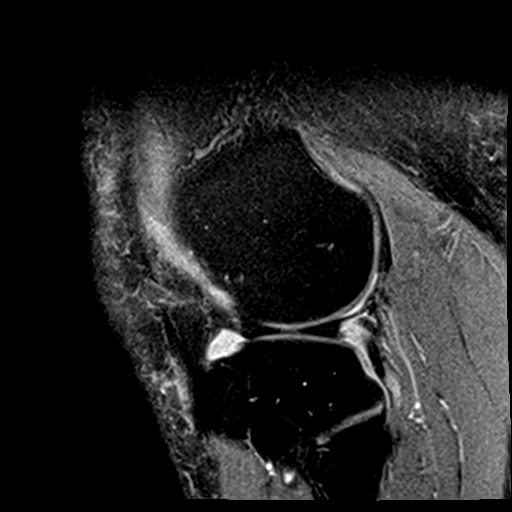
[im 32/32]
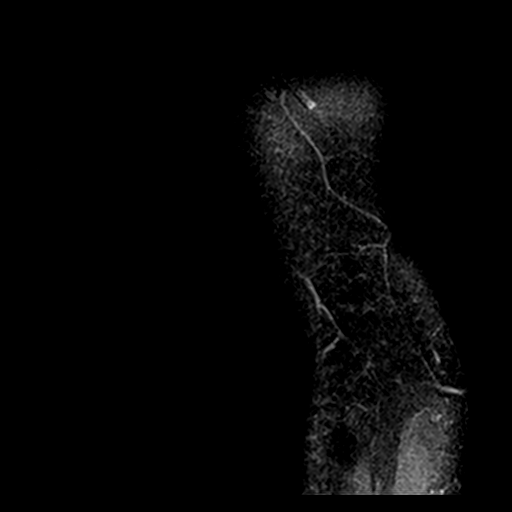

[Series 9: T2 fat-sat · sagittal · 3.0mm · 0.29mm/px · 6 of 32 slices shown (2 of 2)]
[im 1/32]
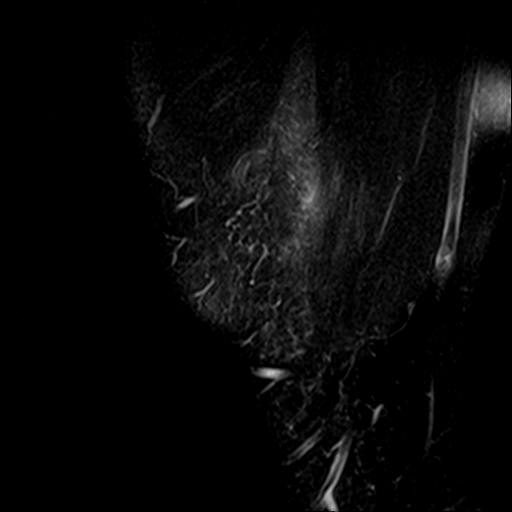
[im 7/32]
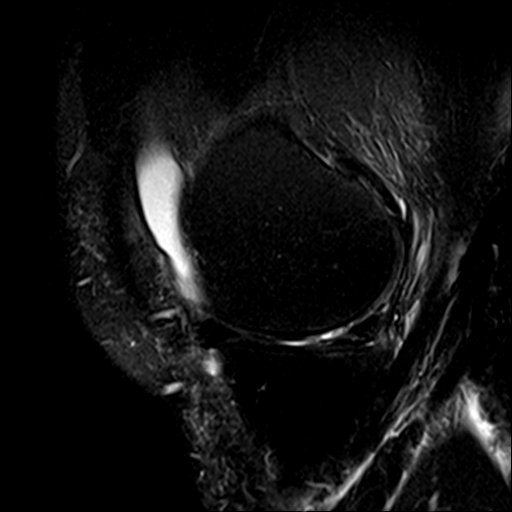
[im 13/32]
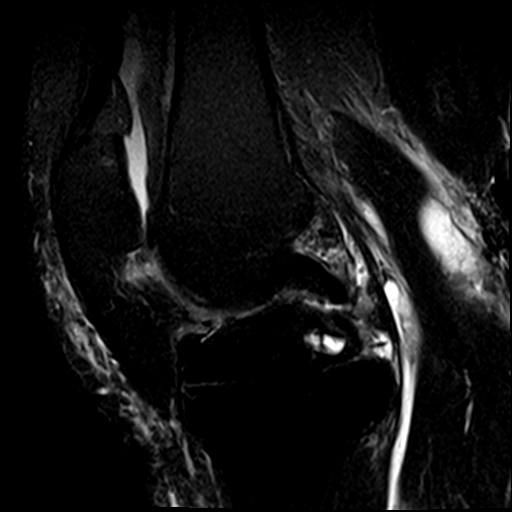
[im 19/32]
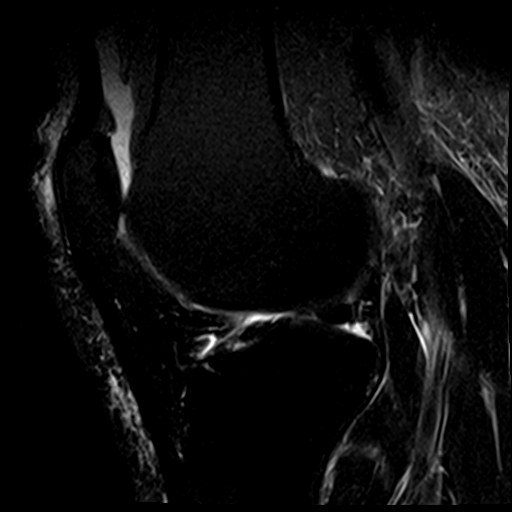
[im 25/32]
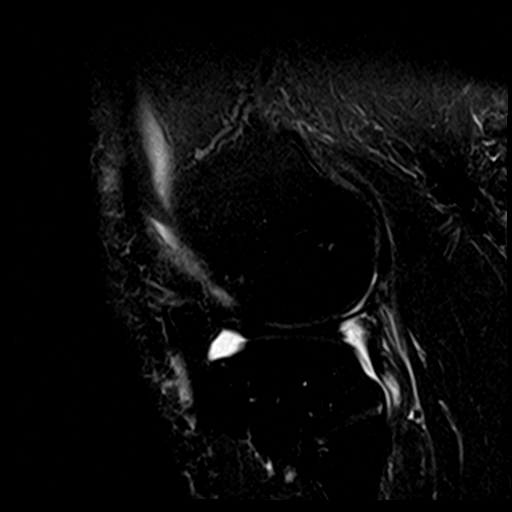
[im 32/32]
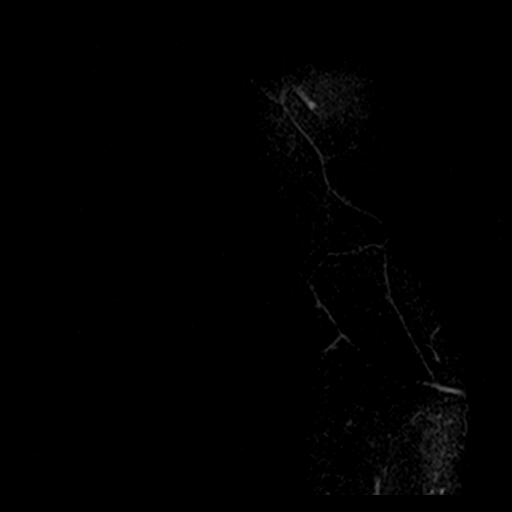

[23 of 40 positions shown; findings below may reference images not displayed]

FINDINGS: MENISCI

Medial meniscus: Complex tear in the posterior horn is mainly
horizontal in orientation reaching the meniscal undersurface.
Extensive intrasubstance degenerative signal is seen in the body and
there is fraying along the free edge of the body.

Lateral meniscus:  Intact.

LIGAMENTS

Cruciates:  Intact.

Collaterals:  Intact.

CARTILAGE

Patellofemoral: Thinning is most notable at the patellar apex and
along the medial trochlea. No focal defect.

Medial:  Markedly thinned and irregular.

Lateral:  Moderately thinned and irregular.

Joint:  Small effusion.

Popliteal Fossa: Baker's cyst measures 3.1 cm transverse by 1.8 cm
AP by 6 cm craniocaudal.

Extensor Mechanism:  Intact.

Bones: No fracture or worrisome lesion. There is some osteophytosis
about the knee.

Other: None.
IMPRESSION: Complex tear in the posterior horn is mainly horizontal in
orientation reaching the meniscal undersurface. The body of the
medial meniscus is degenerated without focal tear.

Osteoarthritis about the knee appears worst in the medial
compartment.

Baker's cyst.

## 2020-04-03 DIAGNOSIS — I2510 Atherosclerotic heart disease of native coronary artery without angina pectoris: Secondary | ICD-10-CM | POA: Diagnosis not present

## 2020-04-03 DIAGNOSIS — F329 Major depressive disorder, single episode, unspecified: Secondary | ICD-10-CM | POA: Diagnosis not present

## 2020-04-03 DIAGNOSIS — I252 Old myocardial infarction: Secondary | ICD-10-CM | POA: Diagnosis not present

## 2020-04-03 DIAGNOSIS — E7800 Pure hypercholesterolemia, unspecified: Secondary | ICD-10-CM | POA: Diagnosis not present

## 2020-04-03 DIAGNOSIS — I10 Essential (primary) hypertension: Secondary | ICD-10-CM | POA: Diagnosis not present

## 2020-05-05 DIAGNOSIS — D2261 Melanocytic nevi of right upper limb, including shoulder: Secondary | ICD-10-CM | POA: Diagnosis not present

## 2020-05-05 DIAGNOSIS — D225 Melanocytic nevi of trunk: Secondary | ICD-10-CM | POA: Diagnosis not present

## 2020-05-05 DIAGNOSIS — L814 Other melanin hyperpigmentation: Secondary | ICD-10-CM | POA: Diagnosis not present

## 2020-05-05 DIAGNOSIS — Z85828 Personal history of other malignant neoplasm of skin: Secondary | ICD-10-CM | POA: Diagnosis not present

## 2020-05-05 DIAGNOSIS — L570 Actinic keratosis: Secondary | ICD-10-CM | POA: Diagnosis not present

## 2020-05-05 DIAGNOSIS — L821 Other seborrheic keratosis: Secondary | ICD-10-CM | POA: Diagnosis not present

## 2020-05-05 DIAGNOSIS — D1801 Hemangioma of skin and subcutaneous tissue: Secondary | ICD-10-CM | POA: Diagnosis not present

## 2020-05-12 ENCOUNTER — Other Ambulatory Visit: Payer: Self-pay

## 2020-05-12 ENCOUNTER — Ambulatory Visit (INDEPENDENT_AMBULATORY_CARE_PROVIDER_SITE_OTHER): Payer: Medicare Other

## 2020-05-12 ENCOUNTER — Ambulatory Visit (INDEPENDENT_AMBULATORY_CARE_PROVIDER_SITE_OTHER): Payer: Medicare Other | Admitting: Orthopaedic Surgery

## 2020-05-12 DIAGNOSIS — Z96652 Presence of left artificial knee joint: Secondary | ICD-10-CM

## 2020-05-12 NOTE — Progress Notes (Signed)
Office Visit Note   Patient: Nathan Jones           Date of Birth: 1951-11-17           MRN: [id number] Visit Date: 05/12/2020              Requested by: Lavone Orn, MD [address] [place],  [place] [address] PCP: Lavone Orn, MD   Assessment & Plan: Visit Diagnoses:  1. Status post total left knee replacement     Plan: Nathan Jones is 1 year status post left total knee replacement. He has done well and he is overall very happy with the outcome. He has returned back to pretty much all activities that he wants to do. We will recheck in another year with two-view x-rays of the left knee. In the meantime he will call back with any questions or concerns.  Follow-Up Instructions: Return in about 1 year (around 05/12/2021).   Orders:  Orders Placed This Encounter  Procedures  . XR KNEE 3 VIEW LEFT   No orders of the defined types were placed in this encounter.     Procedures: No procedures performed   Clinical Data: No additional findings.   Subjective: Chief Complaint  Patient presents with  . Left Knee - Follow-up    Left TKA 05/24/2019    Nathan Jones is 1 year status post left total knee replacement. No complaints other than some difficulty with increased flexion and a weird sensation with kneeling. Otherwise doing well.   Review of Systems   Objective: Vital Signs: There were no vitals taken for this visit.  Physical Exam  Ortho Exam Surgical scar fully healed. No joint effusion. No signs of infection. Range of motion is 5 degrees to 120 degrees. Specialty Comments:  No specialty comments available.  Imaging: XR KNEE 3 VIEW LEFT  Result Date: 05/12/2020 Stable total knee replacement without complication    PMFS History: Patient Active Problem List   Diagnosis Date Noted  . Status post total left knee replacement 05/12/2020  . Primary osteoarthritis of left knee 04/15/2019  . S/P left knee arthroscopy 01/23/2019  . Synovitis of left knee 01/16/2019   . Bodies, loose, joint, knee, left 01/16/2019  . Acute medial meniscus tear, left, initial encounter 12/28/2018  . CAD in native artery 05/19/2016  . Hyperlipidemia 05/19/2016  . Smoker 05/19/2016  . Abdominal aortic aneurysm (AAA) without rupture ([place]) 05/19/2016  . AAA (abdominal aortic aneurysm) ([place]) 11/11/2015  . Aortoiliac occlusive disease ([place]) 11/11/2015  . CAD (coronary artery disease), native coronary artery 09/08/2015  . S/P coronary artery stent placement 09/08/2015  . Essential hypertension 09/08/2015  . Dyslipidemia 09/08/2015  . ED (erectile dysfunction) 09/08/2015  . Dizziness 09/08/2015  . Tobacco abuse 09/08/2015   Past Medical History:  Diagnosis Date  . AAA (abdominal aortic aneurysm) (HCC)    3.4 cm 12/2018  . Arthritis   . CAD (coronary artery disease)    stent to LAD 2016  . Depression   . Hyperlipidemia   . Hypertension   . MMT (medial meniscus tear)    left knee  . Myocardial infarction ([place])    in 2016; stent to LAD.   Marland Kitchen Skin cancer   . Sleep apnea    uses CPAP nightly  . Smoker    1ppd    Family History  Problem Relation Age of Onset  . Heart disease Father        pacemaker  . Heart disease Maternal Grandfather   .  Stroke Maternal Grandmother     Past Surgical History:  Procedure Laterality Date  . ACHILLES TENDON SURGERY Left 2018   Dr. Berenice Primas  . APPENDECTOMY  1988  . CARDIAC CATHETERIZATION  06/01/2015   Xience DES to LAD (3.76mmx15mm) - [facility]  . CERVICAL FUSION  2010  . CHOLECYSTECTOMY  1988  . CHONDROPLASTY Left 01/16/2019   Procedure: CHONDROPLASTY AND REMOVAL OF LOOSE BODY;  Surgeon: Leandrew Koyanagi, MD;  Location: [place];  Service: Orthopedics;  Laterality: Left;  . KNEE ARTHROSCOPY WITH MEDIAL MENISECTOMY Left 01/16/2019   Procedure: LEFT KNEE ARTHROSCOPY WITH PARTIAL MEDIAL MENISCECTOMY;  Surgeon: Leandrew Koyanagi, MD;  Location: [place];  Service: Orthopedics;  Laterality:  Left;  . TOTAL KNEE ARTHROPLASTY Left 05/24/2019   Procedure: LEFT TOTAL KNEE ARTHROPLASTY;  Surgeon: Leandrew Koyanagi, MD;  Location: [facility];  Service: Orthopedics;  Laterality: Left;   Social History   Occupational History  . Occupation: Primary school teacher    Comment: retired  Tobacco Use  . Smoking status: Current Every Day Smoker    Packs/day: 1.00    Years: 45.00    Pack years: 45.00    Types: Cigarettes  . Smokeless tobacco: Never Used  Substance and Sexual Activity  . Alcohol use: Yes    Alcohol/week: 0.0 standard drinks    Comment: social   . Drug use: No  . Sexual activity: Yes

## 2020-05-14 DIAGNOSIS — R42 Dizziness and giddiness: Secondary | ICD-10-CM | POA: Diagnosis not present

## 2020-05-29 ENCOUNTER — Telehealth: Payer: Self-pay | Admitting: *Deleted

## 2020-05-29 NOTE — Telephone Encounter (Signed)
Ortho bundle 1 year call and survey completed. 

## 2020-07-06 DIAGNOSIS — I252 Old myocardial infarction: Secondary | ICD-10-CM | POA: Diagnosis not present

## 2020-07-06 DIAGNOSIS — E7800 Pure hypercholesterolemia, unspecified: Secondary | ICD-10-CM | POA: Diagnosis not present

## 2020-07-06 DIAGNOSIS — F329 Major depressive disorder, single episode, unspecified: Secondary | ICD-10-CM | POA: Diagnosis not present

## 2020-07-06 DIAGNOSIS — I2510 Atherosclerotic heart disease of native coronary artery without angina pectoris: Secondary | ICD-10-CM | POA: Diagnosis not present

## 2020-07-06 DIAGNOSIS — I10 Essential (primary) hypertension: Secondary | ICD-10-CM | POA: Diagnosis not present

## 2020-07-21 DIAGNOSIS — D045 Carcinoma in situ of skin of trunk: Secondary | ICD-10-CM | POA: Diagnosis not present

## 2020-07-21 DIAGNOSIS — D1801 Hemangioma of skin and subcutaneous tissue: Secondary | ICD-10-CM | POA: Diagnosis not present

## 2020-07-21 DIAGNOSIS — L821 Other seborrheic keratosis: Secondary | ICD-10-CM | POA: Diagnosis not present

## 2020-07-21 DIAGNOSIS — L820 Inflamed seborrheic keratosis: Secondary | ICD-10-CM | POA: Diagnosis not present

## 2020-07-21 DIAGNOSIS — L814 Other melanin hyperpigmentation: Secondary | ICD-10-CM | POA: Diagnosis not present

## 2020-07-21 DIAGNOSIS — D225 Melanocytic nevi of trunk: Secondary | ICD-10-CM | POA: Diagnosis not present

## 2020-07-21 DIAGNOSIS — Z85828 Personal history of other malignant neoplasm of skin: Secondary | ICD-10-CM | POA: Diagnosis not present

## 2020-08-18 DIAGNOSIS — I10 Essential (primary) hypertension: Secondary | ICD-10-CM | POA: Diagnosis not present

## 2020-08-18 DIAGNOSIS — I739 Peripheral vascular disease, unspecified: Secondary | ICD-10-CM | POA: Diagnosis not present

## 2020-08-18 DIAGNOSIS — F329 Major depressive disorder, single episode, unspecified: Secondary | ICD-10-CM | POA: Diagnosis not present

## 2020-08-18 DIAGNOSIS — E7800 Pure hypercholesterolemia, unspecified: Secondary | ICD-10-CM | POA: Diagnosis not present

## 2020-08-18 DIAGNOSIS — I252 Old myocardial infarction: Secondary | ICD-10-CM | POA: Diagnosis not present

## 2020-08-18 DIAGNOSIS — I714 Abdominal aortic aneurysm, without rupture: Secondary | ICD-10-CM | POA: Diagnosis not present

## 2020-08-18 DIAGNOSIS — Z125 Encounter for screening for malignant neoplasm of prostate: Secondary | ICD-10-CM | POA: Diagnosis not present

## 2020-08-18 DIAGNOSIS — Z0000 Encounter for general adult medical examination without abnormal findings: Secondary | ICD-10-CM | POA: Diagnosis not present

## 2020-08-18 DIAGNOSIS — I2510 Atherosclerotic heart disease of native coronary artery without angina pectoris: Secondary | ICD-10-CM | POA: Diagnosis not present

## 2020-08-18 DIAGNOSIS — G4733 Obstructive sleep apnea (adult) (pediatric): Secondary | ICD-10-CM | POA: Diagnosis not present

## 2020-08-18 DIAGNOSIS — F17200 Nicotine dependence, unspecified, uncomplicated: Secondary | ICD-10-CM | POA: Diagnosis not present

## 2020-08-18 DIAGNOSIS — Z1389 Encounter for screening for other disorder: Secondary | ICD-10-CM | POA: Diagnosis not present

## 2020-08-18 DIAGNOSIS — Z7189 Other specified counseling: Secondary | ICD-10-CM | POA: Diagnosis not present

## 2020-09-17 DIAGNOSIS — I10 Essential (primary) hypertension: Secondary | ICD-10-CM | POA: Diagnosis not present

## 2020-09-17 DIAGNOSIS — E7800 Pure hypercholesterolemia, unspecified: Secondary | ICD-10-CM | POA: Diagnosis not present

## 2020-09-17 DIAGNOSIS — I252 Old myocardial infarction: Secondary | ICD-10-CM | POA: Diagnosis not present

## 2020-09-17 DIAGNOSIS — I2510 Atherosclerotic heart disease of native coronary artery without angina pectoris: Secondary | ICD-10-CM | POA: Diagnosis not present

## 2020-09-17 DIAGNOSIS — F329 Major depressive disorder, single episode, unspecified: Secondary | ICD-10-CM | POA: Diagnosis not present

## 2020-10-20 DIAGNOSIS — Z03818 Encounter for observation for suspected exposure to other biological agents ruled out: Secondary | ICD-10-CM | POA: Diagnosis not present

## 2020-10-20 DIAGNOSIS — R059 Cough, unspecified: Secondary | ICD-10-CM | POA: Diagnosis not present

## 2020-10-21 ENCOUNTER — Ambulatory Visit
Admission: RE | Admit: 2020-10-21 | Discharge: 2020-10-21 | Disposition: A | Discharge status: Home / Self Care | Payer: Medicare Other | Source: Ambulatory Visit | Attending: Internal Medicine | Admitting: Internal Medicine

## 2020-10-21 ENCOUNTER — Other Ambulatory Visit: Payer: Self-pay | Admitting: Internal Medicine

## 2020-10-21 DIAGNOSIS — R059 Cough, unspecified: Secondary | ICD-10-CM

## 2020-11-05 DIAGNOSIS — D1801 Hemangioma of skin and subcutaneous tissue: Secondary | ICD-10-CM | POA: Diagnosis not present

## 2020-11-05 DIAGNOSIS — L814 Other melanin hyperpigmentation: Secondary | ICD-10-CM | POA: Diagnosis not present

## 2020-11-05 DIAGNOSIS — L821 Other seborrheic keratosis: Secondary | ICD-10-CM | POA: Diagnosis not present

## 2020-11-05 DIAGNOSIS — L570 Actinic keratosis: Secondary | ICD-10-CM | POA: Diagnosis not present

## 2020-11-05 DIAGNOSIS — D225 Melanocytic nevi of trunk: Secondary | ICD-10-CM | POA: Diagnosis not present

## 2020-11-05 DIAGNOSIS — Z85828 Personal history of other malignant neoplasm of skin: Secondary | ICD-10-CM | POA: Diagnosis not present

## 2021-01-15 ENCOUNTER — Ambulatory Visit (HOSPITAL_COMMUNITY)
Admission: RE | Admit: 2021-01-15 | Discharge: 2021-01-15 | Disposition: A | Discharge status: Home / Self Care | Payer: Medicare HMO | Source: Ambulatory Visit | Attending: Internal Medicine | Admitting: Internal Medicine

## 2021-01-15 ENCOUNTER — Other Ambulatory Visit: Payer: Self-pay

## 2021-01-15 ENCOUNTER — Other Ambulatory Visit: Payer: Self-pay | Admitting: Internal Medicine

## 2021-01-15 DIAGNOSIS — I714 Abdominal aortic aneurysm, without rupture, unspecified: Secondary | ICD-10-CM

## 2021-05-13 ENCOUNTER — Ambulatory Visit (INDEPENDENT_AMBULATORY_CARE_PROVIDER_SITE_OTHER): Payer: Medicare HMO

## 2021-05-13 ENCOUNTER — Ambulatory Visit (INDEPENDENT_AMBULATORY_CARE_PROVIDER_SITE_OTHER): Payer: Medicare HMO | Admitting: Orthopaedic Surgery

## 2021-05-13 ENCOUNTER — Encounter: Payer: Self-pay | Admitting: Orthopaedic Surgery

## 2021-05-13 VITALS — Ht 75.0 in | Wt 265.0 lb

## 2021-05-13 DIAGNOSIS — Z96652 Presence of left artificial knee joint: Secondary | ICD-10-CM | POA: Diagnosis not present

## 2021-05-13 NOTE — Progress Notes (Signed)
Office Visit Note   Patient: Nathan Jones           Date of Birth: 09-20-1951           MRN: [id number] Visit Date: 05/13/2021              Requested by: Lavone Orn, MD [address] [place],  [place] [address] PCP: Lavone Orn, MD   Assessment & Plan: Visit Diagnoses:  1. Status post total left knee replacement     Plan: Tayven has done well from his knee replacement.  They are in the process of moving to [place] so this will be his last visit with [place] unless there is a problem.  Questions encouraged and answered.  Follow-up as needed.  Follow-Up Instructions: Return if symptoms worsen or fail to improve.   Orders:  Orders Placed This Encounter  Procedures  . XR Knee 1-2 Views Left   No orders of the defined types were placed in this encounter.     Procedures: No procedures performed   Clinical Data: No additional findings.   Subjective: Chief Complaint  Patient presents with  . Left Knee - Follow-up    Left total knee arthroplasty 05/24/2019    [name] is approximately 2 years status post left total knee replacement.  He has no complaints other than some stiffness with deep flexion.  He has some trouble kneeling on the ground.  Otherwise he has no complaints or pain.   Review of Systems   Objective: Vital Signs: Ht 6\' 3"  (1.905 m)   Wt 265 lb (120.2 kg)   BMI 33.12 kg/m   Physical Exam  Ortho Exam Left knee shows fully healed surgical scar.  Range of motion is 3 to 115 degrees. Specialty Comments:  No specialty comments available.  Imaging: XR Knee 1-2 Views Left  Result Date: 05/13/2021 Stable total knee replacement in good alignment.     PMFS History: Patient Active Problem List   Diagnosis Date Noted  . Status post total left knee replacement 05/12/2020  . Primary osteoarthritis of left knee 04/15/2019  . S/P left knee arthroscopy 01/23/2019  . Synovitis of left knee 01/16/2019  . Bodies, loose, joint, knee, left 01/16/2019  .  Acute medial meniscus tear, left, initial encounter 12/28/2018  . CAD in native artery 05/19/2016  . Hyperlipidemia 05/19/2016  . Smoker 05/19/2016  . Abdominal aortic aneurysm (AAA) without rupture ([place]) 05/19/2016  . AAA (abdominal aortic aneurysm) ([place]) 11/11/2015  . Aortoiliac occlusive disease ([place]) 11/11/2015  . CAD (coronary artery disease), native coronary artery 09/08/2015  . S/P coronary artery stent placement 09/08/2015  . Essential hypertension 09/08/2015  . Dyslipidemia 09/08/2015  . ED (erectile dysfunction) 09/08/2015  . Dizziness 09/08/2015  . Tobacco abuse 09/08/2015   Past Medical History:  Diagnosis Date  . AAA (abdominal aortic aneurysm) ([facility])    3.4 cm 12/2018  . Arthritis   . CAD (coronary artery disease)    stent to LAD 2016  . Depression   . Hyperlipidemia   . Hypertension   . MMT (medial meniscus tear)    left knee  . Myocardial infarction ([place])    in 2016; stent to LAD.   Marland Kitchen Skin cancer   . Sleep apnea    uses CPAP nightly  . Smoker    1ppd    Family History  Problem Relation Age of Onset  . Heart disease Father        pacemaker  . Heart disease  Maternal Grandfather   . Stroke Maternal Grandmother     Past Surgical History:  Procedure Laterality Date  . ACHILLES TENDON SURGERY Left 2018   Dr. Berenice Primas  . APPENDECTOMY  1988  . CARDIAC CATHETERIZATION  06/01/2015   Xience DES to LAD (3.11mmx15mm) - [facility]  . CERVICAL FUSION  2010  . CHOLECYSTECTOMY  1988  . CHONDROPLASTY Left 01/16/2019   Procedure: CHONDROPLASTY AND REMOVAL OF LOOSE BODY;  Surgeon: Leandrew Koyanagi, MD;  Location: [place] [facility];  Service: Orthopedics;  Laterality: Left;  . KNEE ARTHROSCOPY WITH MEDIAL MENISECTOMY Left 01/16/2019   Procedure: LEFT KNEE ARTHROSCOPY WITH PARTIAL MEDIAL MENISCECTOMY;  Surgeon: Leandrew Koyanagi, MD;  Location: [place] [facility] [place];  Service: Orthopedics;  Laterality: Left;  . TOTAL KNEE ARTHROPLASTY Left 05/24/2019    Procedure: LEFT TOTAL KNEE ARTHROPLASTY;  Surgeon: Leandrew Koyanagi, MD;  Location: [facility];  Service: Orthopedics;  Laterality: Left;   Social History   Occupational History  . Occupation: Primary school teacher    Comment: retired  Tobacco Use  . Smoking status: Current Every Day Smoker    Packs/day: 1.00    Years: 45.00    Pack years: 45.00    Types: Cigarettes  . Smokeless tobacco: Never Used  Vaping Use  . Vaping Use: Never used  Substance and Sexual Activity  . Alcohol use: Yes    Alcohol/week: 0.0 standard drinks    Comment: social   . Drug use: No  . Sexual activity: Yes
